# Patient Record
Sex: Female | Born: 1940 | Race: White | Hispanic: No | Smoking: Current every day smoker
Health system: Southern US, Community
[De-identification: ages and names within clinical notes are randomized; demographics above are authoritative.]

## PROBLEM LIST (undated history)

## (undated) DIAGNOSIS — I10 Essential (primary) hypertension: Secondary | ICD-10-CM

## (undated) DIAGNOSIS — K559 Vascular disorder of intestine, unspecified: Secondary | ICD-10-CM

## (undated) DIAGNOSIS — I714 Abdominal aortic aneurysm, without rupture, unspecified: Secondary | ICD-10-CM

## (undated) DIAGNOSIS — I219 Acute myocardial infarction, unspecified: Secondary | ICD-10-CM

## (undated) DIAGNOSIS — H3530 Unspecified macular degeneration: Secondary | ICD-10-CM

## (undated) DIAGNOSIS — J449 Chronic obstructive pulmonary disease, unspecified: Secondary | ICD-10-CM

## (undated) DIAGNOSIS — I2510 Atherosclerotic heart disease of native coronary artery without angina pectoris: Secondary | ICD-10-CM

## (undated) HISTORY — DX: Atherosclerotic heart disease of native coronary artery without angina pectoris: I25.10

## (undated) HISTORY — DX: Chronic obstructive pulmonary disease, unspecified: J44.9

## (undated) HISTORY — PX: CHOLECYSTECTOMY: SHX55

## (undated) HISTORY — PX: HERNIA REPAIR: SHX51

## (undated) HISTORY — PX: OTHER SURGICAL HISTORY: SHX169

---

## 2007-12-02 ENCOUNTER — Ambulatory Visit (HOSPITAL_BASED_OUTPATIENT_CLINIC_OR_DEPARTMENT_OTHER): Admission: RE | Admit: 2007-12-02 | Discharge: 2007-12-02 | Payer: Self-pay | Admitting: Family Medicine

## 2013-05-09 ENCOUNTER — Encounter (HOSPITAL_BASED_OUTPATIENT_CLINIC_OR_DEPARTMENT_OTHER): Payer: Self-pay | Admitting: Emergency Medicine

## 2013-05-09 ENCOUNTER — Inpatient Hospital Stay (HOSPITAL_BASED_OUTPATIENT_CLINIC_OR_DEPARTMENT_OTHER)
Admission: EM | Admit: 2013-05-09 | Discharge: 2013-05-12 | DRG: 394 | Disposition: A | Discharge status: Home / Self Care | Payer: Medicare PPO | Attending: Internal Medicine | Admitting: Internal Medicine

## 2013-05-09 ENCOUNTER — Emergency Department (HOSPITAL_BASED_OUTPATIENT_CLINIC_OR_DEPARTMENT_OTHER): Payer: Medicare PPO

## 2013-05-09 DIAGNOSIS — K5289 Other specified noninfective gastroenteritis and colitis: Secondary | ICD-10-CM

## 2013-05-09 DIAGNOSIS — I2510 Atherosclerotic heart disease of native coronary artery without angina pectoris: Secondary | ICD-10-CM | POA: Diagnosis present

## 2013-05-09 DIAGNOSIS — Z7982 Long term (current) use of aspirin: Secondary | ICD-10-CM

## 2013-05-09 DIAGNOSIS — H3530 Unspecified macular degeneration: Secondary | ICD-10-CM | POA: Diagnosis present

## 2013-05-09 DIAGNOSIS — D72829 Elevated white blood cell count, unspecified: Secondary | ICD-10-CM | POA: Diagnosis present

## 2013-05-09 DIAGNOSIS — J449 Chronic obstructive pulmonary disease, unspecified: Secondary | ICD-10-CM | POA: Diagnosis present

## 2013-05-09 DIAGNOSIS — K922 Gastrointestinal hemorrhage, unspecified: Secondary | ICD-10-CM

## 2013-05-09 DIAGNOSIS — K529 Noninfective gastroenteritis and colitis, unspecified: Secondary | ICD-10-CM

## 2013-05-09 DIAGNOSIS — I714 Abdominal aortic aneurysm, without rupture, unspecified: Secondary | ICD-10-CM | POA: Diagnosis present

## 2013-05-09 DIAGNOSIS — Z79899 Other long term (current) drug therapy: Secondary | ICD-10-CM

## 2013-05-09 DIAGNOSIS — K55059 Acute (reversible) ischemia of intestine, part and extent unspecified: Secondary | ICD-10-CM

## 2013-05-09 DIAGNOSIS — Z888 Allergy status to other drugs, medicaments and biological substances status: Secondary | ICD-10-CM

## 2013-05-09 DIAGNOSIS — K219 Gastro-esophageal reflux disease without esophagitis: Secondary | ICD-10-CM | POA: Diagnosis present

## 2013-05-09 DIAGNOSIS — I739 Peripheral vascular disease, unspecified: Secondary | ICD-10-CM | POA: Diagnosis present

## 2013-05-09 DIAGNOSIS — IMO0002 Reserved for concepts with insufficient information to code with codable children: Secondary | ICD-10-CM

## 2013-05-09 DIAGNOSIS — K633 Ulcer of intestine: Secondary | ICD-10-CM | POA: Diagnosis present

## 2013-05-09 DIAGNOSIS — E876 Hypokalemia: Secondary | ICD-10-CM | POA: Diagnosis not present

## 2013-05-09 DIAGNOSIS — F17200 Nicotine dependence, unspecified, uncomplicated: Secondary | ICD-10-CM | POA: Diagnosis present

## 2013-05-09 DIAGNOSIS — K559 Vascular disorder of intestine, unspecified: Secondary | ICD-10-CM | POA: Diagnosis present

## 2013-05-09 DIAGNOSIS — I10 Essential (primary) hypertension: Secondary | ICD-10-CM | POA: Diagnosis present

## 2013-05-09 DIAGNOSIS — R112 Nausea with vomiting, unspecified: Secondary | ICD-10-CM | POA: Diagnosis present

## 2013-05-09 DIAGNOSIS — E860 Dehydration: Secondary | ICD-10-CM

## 2013-05-09 DIAGNOSIS — J4489 Other specified chronic obstructive pulmonary disease: Secondary | ICD-10-CM | POA: Diagnosis present

## 2013-05-09 DIAGNOSIS — E785 Hyperlipidemia, unspecified: Secondary | ICD-10-CM | POA: Diagnosis present

## 2013-05-09 DIAGNOSIS — I252 Old myocardial infarction: Secondary | ICD-10-CM

## 2013-05-09 DIAGNOSIS — E871 Hypo-osmolality and hyponatremia: Secondary | ICD-10-CM | POA: Diagnosis present

## 2013-05-09 DIAGNOSIS — I7090 Unspecified atherosclerosis: Secondary | ICD-10-CM | POA: Diagnosis present

## 2013-05-09 DIAGNOSIS — Z720 Tobacco use: Secondary | ICD-10-CM | POA: Diagnosis present

## 2013-05-09 DIAGNOSIS — Z66 Do not resuscitate: Secondary | ICD-10-CM | POA: Diagnosis present

## 2013-05-09 HISTORY — DX: Abdominal aortic aneurysm, without rupture: I71.4

## 2013-05-09 HISTORY — DX: Vascular disorder of intestine, unspecified: K55.9

## 2013-05-09 HISTORY — DX: Abdominal aortic aneurysm, without rupture, unspecified: I71.40

## 2013-05-09 HISTORY — DX: Unspecified macular degeneration: H35.30

## 2013-05-09 HISTORY — DX: Acute myocardial infarction, unspecified: I21.9

## 2013-05-09 HISTORY — DX: Essential (primary) hypertension: I10

## 2013-05-09 LAB — PROTIME-INR
INR: 0.98 (ref 0.00–1.49)
Prothrombin Time: 12.8 seconds (ref 11.6–15.2)

## 2013-05-09 LAB — LIPASE, BLOOD: Lipase: 33 U/L (ref 11–59)

## 2013-05-09 LAB — CBC WITH DIFFERENTIAL/PLATELET
BASOS ABS: 0 10*3/uL (ref 0.0–0.1)
Basophils Relative: 0 % (ref 0–1)
EOS ABS: 0.1 10*3/uL (ref 0.0–0.7)
EOS PCT: 1 % (ref 0–5)
HCT: 48.2 % — ABNORMAL HIGH (ref 36.0–46.0)
Hemoglobin: 16.6 g/dL — ABNORMAL HIGH (ref 12.0–15.0)
LYMPHS ABS: 2 10*3/uL (ref 0.7–4.0)
Lymphocytes Relative: 13 % (ref 12–46)
MCH: 33.3 pg (ref 26.0–34.0)
MCHC: 34.4 g/dL (ref 30.0–36.0)
MCV: 96.8 fL (ref 78.0–100.0)
Monocytes Absolute: 1.5 10*3/uL — ABNORMAL HIGH (ref 0.1–1.0)
Monocytes Relative: 10 % (ref 3–12)
NEUTROS PCT: 76 % (ref 43–77)
Neutro Abs: 11.9 10*3/uL — ABNORMAL HIGH (ref 1.7–7.7)
PLATELETS: 313 10*3/uL (ref 150–400)
RBC: 4.98 MIL/uL (ref 3.87–5.11)
RDW: 11.4 % — AB (ref 11.5–15.5)
WBC: 15.5 10*3/uL — AB (ref 4.0–10.5)

## 2013-05-09 LAB — URINALYSIS, ROUTINE W REFLEX MICROSCOPIC
Glucose, UA: NEGATIVE mg/dL
HGB URINE DIPSTICK: NEGATIVE
Ketones, ur: 15 mg/dL — AB
NITRITE: NEGATIVE
PH: 6 (ref 5.0–8.0)
Protein, ur: NEGATIVE mg/dL
SPECIFIC GRAVITY, URINE: 1.024 (ref 1.005–1.030)
Urobilinogen, UA: 1 mg/dL (ref 0.0–1.0)

## 2013-05-09 LAB — COMPREHENSIVE METABOLIC PANEL
ALBUMIN: 4 g/dL (ref 3.5–5.2)
ALK PHOS: 97 U/L (ref 39–117)
ALT: 18 U/L (ref 0–35)
AST: 22 U/L (ref 0–37)
BUN: 14 mg/dL (ref 6–23)
CALCIUM: 10.5 mg/dL (ref 8.4–10.5)
CO2: 32 mEq/L (ref 19–32)
Chloride: 89 mEq/L — ABNORMAL LOW (ref 96–112)
Creatinine, Ser: 0.8 mg/dL (ref 0.50–1.10)
GFR calc non Af Amer: 70 mL/min — ABNORMAL LOW (ref >90)
GFR, EST AFRICAN AMERICAN: 82 mL/min — AB (ref >90)
GLUCOSE: 118 mg/dL — AB (ref 70–99)
POTASSIUM: 3.8 meq/L (ref 3.7–5.3)
SODIUM: 135 meq/L — AB (ref 137–147)
TOTAL PROTEIN: 8.1 g/dL (ref 6.0–8.3)
Total Bilirubin: 0.5 mg/dL (ref 0.3–1.2)

## 2013-05-09 LAB — LACTIC ACID, PLASMA: LACTIC ACID, VENOUS: 1.3 mmol/L (ref 0.5–2.2)

## 2013-05-09 LAB — TYPE AND SCREEN
ABO/RH(D): A POS
ANTIBODY SCREEN: NEGATIVE

## 2013-05-09 LAB — URINE MICROSCOPIC-ADD ON: RBC / HPF: NONE SEEN RBC/hpf (ref <3)

## 2013-05-09 LAB — I-STAT CG4 LACTIC ACID, ED: Lactic Acid, Venous: 1.51 mmol/L (ref 0.5–2.2)

## 2013-05-09 LAB — MRSA PCR SCREENING: MRSA BY PCR: NEGATIVE

## 2013-05-09 LAB — OCCULT BLOOD X 1 CARD TO LAB, STOOL: Fecal Occult Bld: POSITIVE — AB

## 2013-05-09 LAB — MAGNESIUM: MAGNESIUM: 1.7 mg/dL (ref 1.5–2.5)

## 2013-05-09 LAB — PHOSPHORUS: Phosphorus: 3.3 mg/dL (ref 2.3–4.6)

## 2013-05-09 MED ORDER — ACETAMINOPHEN 325 MG PO TABS
650.0000 mg | ORAL_TABLET | Freq: Four times a day (QID) | ORAL | Status: DC | PRN
Start: 2013-05-09 — End: 2013-05-12
  Administered 2013-05-09: 650 mg via ORAL
  Filled 2013-05-09: qty 2

## 2013-05-09 MED ORDER — ONDANSETRON HCL 4 MG/2ML IJ SOLN
4.0000 mg | Freq: Once | INTRAMUSCULAR | Status: AC
  Administered 2013-05-09: 4 mg via INTRAVENOUS
  Filled 2013-05-09: qty 2

## 2013-05-09 MED ORDER — NICOTINE 14 MG/24HR TD PT24
14.0000 mg | MEDICATED_PATCH | Freq: Every day | TRANSDERMAL | Status: DC
  Administered 2013-05-11 – 2013-05-12 (×2): 14 mg via TRANSDERMAL
  Filled 2013-05-09 (×4): qty 1

## 2013-05-09 MED ORDER — ACETAMINOPHEN 650 MG RE SUPP: 650.0000 mg | Freq: Four times a day (QID) | RECTAL | Status: DC | PRN

## 2013-05-09 MED ORDER — ONDANSETRON HCL 4 MG/2ML IJ SOLN: 4.0000 mg | Freq: Four times a day (QID) | INTRAMUSCULAR | Status: DC | PRN

## 2013-05-09 MED ORDER — IOHEXOL 300 MG/ML  SOLN
50.0000 mL | Freq: Once | INTRAMUSCULAR | Status: AC | PRN
  Administered 2013-05-09: 50 mL via ORAL

## 2013-05-09 MED ORDER — ALBUTEROL SULFATE (2.5 MG/3ML) 0.083% IN NEBU: 2.5000 mg | INHALATION_SOLUTION | RESPIRATORY_TRACT | Status: DC | PRN

## 2013-05-09 MED ORDER — SODIUM CHLORIDE 0.9 % IV SOLN
INTRAVENOUS | Status: DC
  Administered 2013-05-09 – 2013-05-12 (×4): via INTRAVENOUS

## 2013-05-09 MED ORDER — FAMOTIDINE 200 MG/20ML IV SOLN
40.0000 mg | INTRAVENOUS | Status: DC
  Administered 2013-05-09 – 2013-05-11 (×3): 40 mg via INTRAVENOUS
  Filled 2013-05-09 (×5): qty 4

## 2013-05-09 MED ORDER — IOHEXOL 300 MG/ML  SOLN
100.0000 mL | Freq: Once | INTRAMUSCULAR | Status: AC | PRN
  Administered 2013-05-09: 100 mL via INTRAVENOUS

## 2013-05-09 MED ORDER — METRONIDAZOLE IN NACL 5-0.79 MG/ML-% IV SOLN
500.0000 mg | Freq: Three times a day (TID) | INTRAVENOUS | Status: DC
  Administered 2013-05-09 – 2013-05-10 (×2): 500 mg via INTRAVENOUS
  Filled 2013-05-09 (×3): qty 100

## 2013-05-09 MED ORDER — MORPHINE SULFATE 2 MG/ML IJ SOLN: 1.0000 mg | INTRAMUSCULAR | Status: DC | PRN

## 2013-05-09 MED ORDER — CIPROFLOXACIN IN D5W 400 MG/200ML IV SOLN
400.0000 mg | Freq: Two times a day (BID) | INTRAVENOUS | Status: DC
  Administered 2013-05-09 – 2013-05-10 (×2): 400 mg via INTRAVENOUS
  Filled 2013-05-09 (×2): qty 200

## 2013-05-09 MED ORDER — METOPROLOL TARTRATE 1 MG/ML IV SOLN
2.5000 mg | Freq: Two times a day (BID) | INTRAVENOUS | Status: DC
  Administered 2013-05-09: 2.5 mg via INTRAVENOUS
  Filled 2013-05-09 (×3): qty 5

## 2013-05-09 MED ORDER — ONDANSETRON HCL 4 MG PO TABS: 4.0000 mg | ORAL_TABLET | Freq: Four times a day (QID) | ORAL | Status: DC | PRN

## 2013-05-09 MED ORDER — SODIUM CHLORIDE 0.9 % IV BOLUS (SEPSIS)
1000.0000 mL | Freq: Once | INTRAVENOUS | Status: AC
  Administered 2013-05-09: 1000 mL via INTRAVENOUS

## 2013-05-09 MED ORDER — PIPERACILLIN-TAZOBACTAM 3.375 G IVPB 30 MIN
3.3750 g | Freq: Once | INTRAVENOUS | Status: AC
  Administered 2013-05-09: 3.375 g via INTRAVENOUS
  Filled 2013-05-09 (×2): qty 50

## 2013-05-09 NOTE — ED Notes (Signed)
Abdominal pain for a week. Diarrhea. Vomiting. She had a CT scan of her abdomen that showed left ovarian swelling per pt. The plan is to f/u with a GYN.

## 2013-05-09 NOTE — ED Notes (Signed)
Pt informed of plan of care.  Waiting on bed assignment.

## 2013-05-09 NOTE — ED Notes (Signed)
Patient in restroom.

## 2013-05-09 NOTE — ED Notes (Signed)
Family at bedside. 

## 2013-05-09 NOTE — Consult Note (Signed)
Reason for Consult:  ischeemic bowel Referring Physician:  Dr. Pattricia Boss  Evelyn Miller is an 73 y.o. female.  HPI:  Patient is a 73 year old female with significant history for peripheral vascular disease and abdominal aortic aneurysm who presents with 2 weeks of worsening abdominal pain and diarrhea after eating. She has been afraid to eat because of the symptoms. She has a known aneurysm that is being followed by Dr. Maryjean Morn in [facility].  She denies any fevers and chills. She has started to develop bloody stools in association with the crampy pain.  She describes the pain as more of a sore crampy discomfort than a severe stabbing pain. The pain is better when she does not eat and when she does not have bowel movements.  Past Medical History  Diagnosis Date  . Hypertension   . MI (myocardial infarction)   . AAA (abdominal aortic aneurysm)     Past Surgical History  Procedure Laterality Date  . Cholecystectomy    . Abdominal hysterectomy    . Hernia repair      No family history on file.  Social History:  reports that she has been smoking Cigarettes.  She has been smoking about 1.00 pack per day. She does not have any smokeless tobacco history on file. She reports that she drinks alcohol. She reports that she does not use illicit drugs.  Allergies:  Allergies  Allergen Reactions  . Codeine     weakness    Medications:  Prior to Admission:  Prescriptions prior to admission  Medication Sig Dispense Refill  . aspirin 81 MG tablet Take 81 mg by mouth daily.      Marland Kitchen losartan-hydrochlorothiazide (HYZAAR) 100-25 MG per tablet Take 1 tablet by mouth daily.      Marland Kitchen OMEPRAZOLE PO Take by mouth.      . Ondansetron HCl (ZOFRAN PO) Take by mouth.      . propranolol (INDERAL) 80 MG tablet Take 80 mg by mouth 3 (three) times daily.      . TRAMADOL HCL PO Take by mouth.        Results for orders placed during the hospital encounter of 05/09/13 (from the past 48 hour(s))  URINALYSIS,  ROUTINE W REFLEX MICROSCOPIC     Status: Abnormal   Collection Time    05/09/13  2:01 PM      Result Value Ref Range   Color, Urine AMBER (*) YELLOW   Comment: BIOCHEMICALS MAY BE AFFECTED BY COLOR   APPearance CLOUDY (*) CLEAR   Specific Gravity, Urine 1.024  1.005 - 1.030   pH 6.0  5.0 - 8.0   Glucose, UA NEGATIVE  NEGATIVE mg/dL   Hgb urine dipstick NEGATIVE  NEGATIVE   Bilirubin Urine SMALL (*) NEGATIVE   Ketones, ur 15 (*) NEGATIVE mg/dL   Protein, ur NEGATIVE  NEGATIVE mg/dL   Urobilinogen, UA 1.0  0.0 - 1.0 mg/dL   Nitrite NEGATIVE  NEGATIVE   Leukocytes, UA TRACE (*) NEGATIVE  URINE MICROSCOPIC-ADD ON     Status: Abnormal   Collection Time    05/09/13  2:01 PM      Result Value Ref Range   Squamous Epithelial / LPF FEW (*) RARE   WBC, UA 0-2  <3 WBC/hpf   RBC / HPF    <3 RBC/hpf   Value: NO FORMED ELEMENTS SEEN ON URINE MICROSCOPIC EXAMINATION   Bacteria, UA FEW (*) RARE  CBC WITH DIFFERENTIAL     Status: Abnormal   Collection  Time    05/09/13  2:20 PM      Result Value Ref Range   WBC 15.5 (*) 4.0 - 10.5 K/uL   RBC 4.98  3.87 - 5.11 MIL/uL   Hemoglobin 16.6 (*) 12.0 - 15.0 g/dL   HCT 48.2 (*) 36.0 - 46.0 %   MCV 96.8  78.0 - 100.0 fL   MCH 33.3  26.0 - 34.0 pg   MCHC 34.4  30.0 - 36.0 g/dL   RDW 11.4 (*) 11.5 - 15.5 %   Platelets 313  150 - 400 K/uL   Neutrophils Relative % 76  43 - 77 %   Neutro Abs 11.9 (*) 1.7 - 7.7 K/uL   Lymphocytes Relative 13  12 - 46 %   Lymphs Abs 2.0  0.7 - 4.0 K/uL   Monocytes Relative 10  3 - 12 %   Monocytes Absolute 1.5 (*) 0.1 - 1.0 K/uL   Eosinophils Relative 1  0 - 5 %   Eosinophils Absolute 0.1  0.0 - 0.7 K/uL   Basophils Relative 0  0 - 1 %   Basophils Absolute 0.0  0.0 - 0.1 K/uL  COMPREHENSIVE METABOLIC PANEL     Status: Abnormal   Collection Time    05/09/13  2:20 PM      Result Value Ref Range   Sodium 135 (*) 137 - 147 mEq/L   Potassium 3.8  3.7 - 5.3 mEq/L   Chloride 89 (*) 96 - 112 mEq/L   CO2 32  19 - 32  mEq/L   Glucose, Bld 118 (*) 70 - 99 mg/dL   BUN 14  6 - 23 mg/dL   Creatinine, Ser 0.80  0.50 - 1.10 mg/dL   Calcium 10.5  8.4 - 10.5 mg/dL   Total Protein 8.1  6.0 - 8.3 g/dL   Albumin 4.0  3.5 - 5.2 g/dL   AST 22  0 - 37 U/L   ALT 18  0 - 35 U/L   Alkaline Phosphatase 97  39 - 117 U/L   Total Bilirubin 0.5  0.3 - 1.2 mg/dL   GFR calc non Af Amer 70 (*) >90 mL/min   GFR calc Af Amer 82 (*) >90 mL/min   Comment: (NOTE)     The eGFR has been calculated using the CKD EPI equation.     This calculation has not been validated in all clinical situations.     eGFR's persistently <90 mL/min signify possible Chronic Kidney     Disease.  LIPASE, BLOOD     Status: None   Collection Time    05/09/13  2:20 PM      Result Value Ref Range   Lipase 33  11 - 59 U/L  OCCULT BLOOD X 1 CARD TO LAB, STOOL     Status: Abnormal   Collection Time    05/09/13  2:20 PM      Result Value Ref Range   Fecal Occult Bld POSITIVE (*) NEGATIVE  PROTIME-INR     Status: None   Collection Time    05/09/13  5:30 PM      Result Value Ref Range   Prothrombin Time 12.8  11.6 - 15.2 seconds   INR 0.98  0.00 - 1.49  I-STAT CG4 LACTIC ACID, ED     Status: None   Collection Time    05/09/13  5:38 PM      Result Value Ref Range   Lactic Acid, Venous 1.51  0.5 - 2.2  mmol/L    Ct Abdomen Pelvis W Contrast  05/09/2013   CLINICAL DATA:  Chest pain, lower abdominal pain.  EXAM: CT ABDOMEN AND PELVIS WITH CONTRAST  TECHNIQUE: Multidetector CT imaging of the abdomen and pelvis was performed using the standard protocol following bolus administration of intravenous contrast.  CONTRAST:  28mL OMNIPAQUE IOHEXOL 300 MG/ML SOLN, 124mL OMNIPAQUE IOHEXOL 300 MG/ML SOLN  COMPARISON:  CT ABD-PELV W/ CM dated 05/02/2013  FINDINGS: Lung bases are clear.  No pericardial fluid.  No focal hepatic lesion. Post cholecystectomy. Common bile duct is dilated to the level ampulla measuring 13 mm. This is not changed in short interval follow-up.  Pancreas is normal without evidence of pancreatic duct dilatation. No intrahepatic duct dilatation of significance.  The spleen, adrenal glands, and kidneys are normal.  The stomach, small bowel, appendix, and cecum are normal. There is mild bowel wall thickening involving the descending colon over a long segment extending from the splenic flexure to the sigmoid colon (axial image 56 and coronal image 50 by for example. There is minimal diverticular disease through this region. Rectum is normal.  Abdominal aorta is aneurysmal measuring 4.8 x 4.5 cm in axial dimension unchanged from prior. Aneurysm dilatation of the suprarenal aorta measures 5.5 cm. The SMA is not diminutive seen on sagittal image 60. The IMA is not well appreciated.  No retroperitoneal periportal lymphadenopathy. No free fluid the pelvis. Hysterectomy. The bladder is normal. The uterus is normal. No aggressive osseous lesion. Process in the sacrum noted.  IMPRESSION: 1. Long segment of bowel wall inflammation involving the descending colon is new from scan 1 week prior. This finding is consistent with segmental colitis. Concern for ischemic colitis in this watershed location. The SMA is diminutive extending from the aneurysmal aorta. Secondary differentials would include infectious or inflammatory colitis including inflammatory bowel disease. 2. The SMA is diminutive and the IMA is difficult to define. The celiac trunk is the most robust branch vessel. 3. Stable severe aneurysmal dilatation of the infrarenal and suprarenal abdominal aorta.   Electronically Signed   By: Suzy Bouchard M.D.   On: 05/09/2013 16:12   Dg Chest Portable 1 View  05/09/2013   CLINICAL DATA:  Pain  EXAM: PORTABLE CHEST - 1 VIEW  COMPARISON:  December 02, 2007  FINDINGS: There is underlying emphysematous change. There is no edema or consolidation. Heart is mildly enlarged with pulmonary vascularity within normal limits. Aorta is tortuous and rather prominent but stable.  No adenopathy. No bone lesions.  IMPRESSION: Underlying emphysematous change. No edema or consolidation. Heart prominent but stable. Aorta tortuous but stable.   Electronically Signed   By: Lowella Grip M.D.   On: 05/09/2013 16:20    Review of Systems  Constitutional: Positive for chills and malaise/fatigue. Negative for fever.  HENT: Negative.   Eyes: Negative.   Respiratory: Negative.   Cardiovascular: Negative.   Gastrointestinal: Positive for nausea, abdominal pain, diarrhea and blood in stool.  Genitourinary: Negative.   Musculoskeletal: Negative.   Skin: Negative.   Neurological: Negative.   Endo/Heme/Allergies: Negative.   Psychiatric/Behavioral: Negative.    Blood pressure 135/92, pulse 82, temperature 98 F (36.7 C), temperature source Oral, resp. rate 16, SpO2 100.00%. Physical Exam  Constitutional: She is oriented to person, place, and time. She appears well-developed and well-nourished. No distress.  HENT:  Head: Normocephalic and atraumatic.  Mouth/Throat: No oropharyngeal exudate.  Eyes: Conjunctivae are normal. Pupils are equal, round, and reactive to light. Right eye exhibits no discharge.  Left eye exhibits no discharge. No scleral icterus.  Neck: Normal range of motion. Neck supple. No thyromegaly present.  Cardiovascular: Normal rate, regular rhythm and normal heart sounds.  Exam reveals no gallop and no friction rub.   No murmur heard. Faint distal pulses.  Respiratory: Effort normal. No respiratory distress. She has wheezes.  GI: Soft. Bowel sounds are normal. She exhibits no distension. There is no tenderness. There is no rebound and no guarding.  Musculoskeletal: Normal range of motion. She exhibits no edema.  Neurological: She is alert and oriented to person, place, and time. Coordination normal.  Skin: Skin is warm and dry. She is not diaphoretic.  Psychiatric: She has a normal mood and affect. Her behavior is normal. Judgment and thought content normal.     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   Dehydration GI bleed.  Would treat with hydration, avoiding hypotension, bowel rest, and IV antibiotics for possible translocation. Recommend GI consult. No acute surgical intervention. Will follow.   Dr. Denyce Robert in [facility] is her vascular surgeon.  We do not have the ability to access what prior workup he  has done or what his opinion of her mesenteric vascular disease is and whether or not she would benefit from any intervention.     Evelyn Miller 05/09/2013, 8:13 PM

## 2013-05-09 NOTE — ED Provider Notes (Signed)
CSN: [id number]     Arrival date & time 05/09/13  1316 History   First MD Initiated Contact with Patient 05/09/13 1352     Chief Complaint  Patient presents with  . Abdominal Pain     (Consider location/radiation/quality/duration/timing/severity/associated sxs/prior Treatment) HPI This is a 73 year old female who comes in today complaining of abdominal pain. She states she has had a two-week history of abdominal discomfort. It began with some lower abdominal cramping followed by diarrhea. She states she had multiple episodes of diarrhea then began having nausea and vomiting for several days. She was seen by her primary care physician and reports that she had a CT of her abdomen. She states that she was told there is some abnormality of blood flow in the pelvis and she was going to be referred to gynecology. She has so far not received gynecology followup. She states that she has an abdominal aneurysm and she had an ultrasound done for that the day after her CT scan. She states she has been followed for this and it has remained stable.  She has had very little stooling over the past week. She has had decreased intake but states that she has been drinking Gatorade and ginger ale and Ensure. She denies fever although she has had some chills. Her son has had some similar symptoms with stooling. She reports no traveling, camping, or other sick contacts, or known food issues. She has some feelings of general mobilized and weakness. She denies headache, head pain, chest pain, dyspnea, cough, or urinary tract infection symptoms. Past Medical History  Diagnosis Date  . Hypertension   . MI (myocardial infarction)   . AAA (abdominal aortic aneurysm)    Past Surgical History  Procedure Laterality Date  . Cholecystectomy    . Abdominal hysterectomy    . Hernia repair     No family history on file. History  Substance Use Topics  . Smoking status: Current Every Day Smoker -- 1.00 packs/day    Types:  Cigarettes  . Smokeless tobacco: Not on file  . Alcohol Use: Yes   OB History   Grav Para Term Preterm Abortions TAB SAB Ect Mult Living                 Review of Systems  Constitutional: Positive for chills and activity change. Negative for fever and unexpected weight change.  HENT: Negative.   Eyes: Negative.   Respiratory: Negative.   Cardiovascular: Negative.   Gastrointestinal: Positive for nausea, vomiting, diarrhea and constipation.  Endocrine: Negative.   Genitourinary: Negative.   Musculoskeletal: Negative.   Skin: Negative.   Allergic/Immunologic: Negative.   Neurological: Negative.   Hematological: Negative.   Psychiatric/Behavioral: Negative.   All other systems reviewed and are negative.     Allergies  Codeine  Home Medications   Prior to Admission medications   Medication Sig Start Date End Date Taking? Authorizing Provider  aspirin 81 MG tablet Take 81 mg by mouth daily.   Yes Historical Provider, MD  losartan-hydrochlorothiazide (HYZAAR) 100-25 MG per tablet Take 1 tablet by mouth daily.   Yes Historical Provider, MD  OMEPRAZOLE PO Take by mouth.   Yes Historical Provider, MD  Ondansetron HCl (ZOFRAN PO) Take by mouth.   Yes Historical Provider, MD  propranolol (INDERAL) 80 MG tablet Take 80 mg by mouth 3 (three) times daily.   Yes Historical Provider, MD  TRAMADOL HCL PO Take by mouth.   Yes Historical Provider, MD   BP 171/90  Pulse 65  Temp(Src) 97.8 F (36.6 C) (Oral)  Resp 20  SpO2 100% Physical Exam  Nursing note and vitals reviewed. Constitutional: She is oriented to person, place, and time. She appears well-developed.  HENT:  Head: Normocephalic.  Right Ear: External ear normal.  Left Ear: External ear normal.  Nose: Nose normal.  Mouth/Throat: Oropharynx is clear and moist.  Eyes: Conjunctivae and EOM are normal. Pupils are equal, round, and reactive to light.  Neck: Normal range of motion. Neck supple.  Cardiovascular: Normal rate,  regular rhythm, normal heart sounds and intact distal pulses.   Pulmonary/Chest: Effort normal and breath sounds normal.  Abdominal: Soft. Bowel sounds are normal. She exhibits no distension and no mass. There is tenderness. There is rebound. There is no guarding.  Musculoskeletal: Normal range of motion.  Neurological: She is alert and oriented to person, place, and time. She has normal reflexes.  Skin: Skin is warm and dry.  Psychiatric: She has a normal mood and affect. Her behavior is normal. Judgment and thought content normal.    ED Course  Procedures (including critical care time) Labs Review Labs Reviewed  URINALYSIS, ROUTINE W REFLEX MICROSCOPIC - Abnormal; Notable for the following:    Color, Urine AMBER (*)    APPearance CLOUDY (*)    Bilirubin Urine SMALL (*)    Ketones, ur 15 (*)    Leukocytes, UA TRACE (*)    All other components within normal limits  URINE MICROSCOPIC-ADD ON - Abnormal; Notable for the following:    Squamous Epithelial / LPF FEW (*)    Bacteria, UA FEW (*)    All other components within normal limits  CBC WITH DIFFERENTIAL - Abnormal; Notable for the following:    WBC 15.5 (*)    Hemoglobin 16.6 (*)    HCT 48.2 (*)    RDW 11.4 (*)    Neutro Abs 11.9 (*)    Monocytes Absolute 1.5 (*)    All other components within normal limits  COMPREHENSIVE METABOLIC PANEL - Abnormal; Notable for the following:    Sodium 135 (*)    Chloride 89 (*)    Glucose, Bld 118 (*)    GFR calc non Af Amer 70 (*)    GFR calc Af Amer 82 (*)    All other components within normal limits  LIPASE, BLOOD  OCCULT BLOOD X 1 CARD TO LAB, STOOL    Imaging Review Ct Abdomen Pelvis W Contrast  05/09/2013   CLINICAL DATA:  Chest pain, lower abdominal pain.  EXAM: CT ABDOMEN AND PELVIS WITH CONTRAST  TECHNIQUE: Multidetector CT imaging of the abdomen and pelvis was performed using the standard protocol following bolus administration of intravenous contrast.  CONTRAST:  50mL  OMNIPAQUE IOHEXOL 300 MG/ML SOLN, OMNIPAQUE IOHEXOL 300 MG/ML SOLN  COMPARISON:  CT ABD-PELV W/ CM dated 05/02/2013  FINDINGS: Lung bases are clear.  No pericardial fluid.  No focal hepatic lesion. Post cholecystectomy. Common bile duct is dilated to the level ampulla measuring 13 mm. This is not changed in short interval follow-up. Pancreas is normal without evidence of pancreatic duct dilatation. No intrahepatic duct dilatation of significance.  The spleen, adrenal glands, and kidneys are normal.  The stomach, small bowel, appendix, and cecum are normal. There is mild bowel wall thickening involving the descending colon over a long segment extending from the splenic flexure to the sigmoid colon (axial image 56 and coronal image 50 by for example. There is minimal diverticular disease through this region.  Rectum is normal.  Abdominal aorta is aneurysmal measuring 4.8 x 4.5 cm in axial dimension unchanged from prior. Aneurysm dilatation of the suprarenal aorta measures 5.5 cm. The SMA is not diminutive seen on sagittal image 60. The IMA is not well appreciated.  No retroperitoneal periportal lymphadenopathy. No free fluid the pelvis. Hysterectomy. The bladder is normal. The uterus is normal. No aggressive osseous lesion. Process in the sacrum noted.  IMPRESSION: 1. Long segment of bowel wall inflammation involving the descending colon is new from scan 1 week prior. This finding is consistent with segmental colitis. Concern for ischemic colitis in this watershed location. The SMA is diminutive extending from the aneurysmal aorta. Secondary differentials would include infectious or inflammatory colitis including inflammatory bowel disease. 2. The SMA is diminutive and the IMA is difficult to define. The celiac trunk is the most robust branch vessel. 3. Stable severe aneurysmal dilatation of the infrarenal and suprarenal abdominal aorta.   Electronically Signed   By: Genevive BiStewart  Edmunds M.D.   On: 05/09/2013 16:12    Dg Chest Portable 1 View  05/09/2013   CLINICAL DATA:  Pain  EXAM: PORTABLE CHEST - 1 VIEW  COMPARISON:  December 02, 2007  FINDINGS: There is underlying emphysematous change. There is no edema or consolidation. Heart is mildly enlarged with pulmonary vascularity within normal limits. Aorta is tortuous and rather prominent but stable. No adenopathy. No bone lesions.  IMPRESSION: Underlying emphysematous change. No edema or consolidation. Heart prominent but stable. Aorta tortuous but stable.   Electronically Signed   By: Bretta BangWilliam  Woodruff M.D.   On: 05/09/2013 16:20     EKG Interpretation   Date/Time:  Tuesday May 09 2013 15:01:59 EDT Ventricular Rate:  67 PR Interval:  174 QRS Duration: 102 QT Interval:  400 QTC Calculation: 422 R Axis:   23 Text Interpretation:  Normal sinus rhythm Anterior infarct , age  undetermined Abnormal ECG Confirmed by Aila Terra MD, Duwayne HeckANIELLE [phone]([id number]) on  05/09/2013 3:39:11 PM       Called to patient's bedside by nursing. Reported that patient became mottled following CT scan and felt like she needed to have a bowel movement and they noted blood on the bed. Patient continues somewhat pale with some increased shortness of breath with mottled fingers. Abdomen continues tender in the lower quadrants. Review of previous Hemoccult is positive. Stool now appears to be somewhat bloody. Follow my exam patient did have a large loose dark stool consistent with having blood. I feel this is likely secondary to ischemic colitis consistent with findings on her CT scan and history. I discussed these findings the patient and her family. General surgery is being contacted.  She will also be covered with antibiotics for possible infectious colitis. MDM   Final diagnoses:  GI bleeding  Colitis   Discussed with Drs. Donell BeersByerly and [place] and patient [facility]   CRITICAL CARE Performed by: Hilario Quarryanielle S Medea Deines Total critical care time: 2745 Critical care  time was exclusive of separately billable procedures and treating other patients. Critical care was necessary to treat or prevent imminent or life-threatening deterioration. Critical care was time spent personally by me on the following activities: development of treatment plan with patient and/or surrogate as well as nursing, discussions with consultants, evaluation of patient's response to treatment, examination of patient, obtaining history from patient or surrogate, ordering and performing treatments and interventions, ordering and review of laboratory studies, ordering and review of radiographic studies, pulse oximetry  and re-evaluation of patient's condition.      Hilario Quarry, MD 05/09/13 [phone]

## 2013-05-09 NOTE — ED Notes (Signed)
PO contrast administered.

## 2013-05-09 NOTE — ED Notes (Signed)
Patient transported to CT 

## 2013-05-09 NOTE — ED Notes (Signed)
Called to CT by CT Tech. Pt c/o abdominal pain and need to have a BM.  She appears tachypneic and extremities cool.  EDP informed and at bedside.  PT CAOx4.  #2 IV site established.

## 2013-05-09 NOTE — ED Notes (Signed)
Radiology at bedside for portable XR.

## 2013-05-09 NOTE — ED Notes (Signed)
[facility]

## 2013-05-09 NOTE — ED Notes (Signed)
MD at bedside. 

## 2013-05-09 NOTE — ED Notes (Signed)
[facility] at bedside preparing to transport. 

## 2013-05-09 NOTE — ED Notes (Signed)
Pt reports she has had abdominal pain for over a week and is described as cramping and increases with paplation.  She was seen by PA at [facility] on Tuesday, had an XR and told she has "blood vessels around my left ovary enlarged".  Took Tramadol and Zofran over the weekend, felt better but today pain seems worse and she is unable to keep PO or food down.  States she cannot recall last normal BM and feels constipated.

## 2013-05-09 NOTE — Progress Notes (Addendum)
[facility]. Patient is Evelyn Madeiraorma Moncure, 73 y/o female with pmh significant for tobacco abuse, HLD, known AAA, CAD/MI in the past and HTN; presented with 2 weeks hx of on/off abd pain. Patient reports pain is worse with eating and has been associated with nausea, vomiting, diarrhea initially and then maroon color stools. She reports no traveling, camping, or other sick contacts, or known food issues. She has some feelings of general mobilized and weakness. She denies headache, head pain, chest pain, dyspnea, cough, or urinary tract infection symptoms. [facility] called to admit patient for further evaluation and treatment. Most likely ischemic colitis; other considerations includes IBD vs infectious colitis.  [facility]. General surgery and GI has been consulted by ED.  Plan: -Admit to step down -Cipro/flagyl empirically for overlapping on infectious colitis (discussed with GI and in agreement) -Check C. Diff by PCR -NPO status for bowel rest -Avoid hypotension (hold home antihypertensive regimen) -PRN analgesics and antiemetics -IVF's and supportive care   Evelyn Miller [phone]

## 2013-05-09 NOTE — H&P (Signed)
[facility] History and Physical  Cherina Dhillon WUJ:[id number] DOB: [birth year] DOA: 05/09/2013  Referring physician: Dr. Rosalia Hammers PCP: Angelica Chessman., MD   Chief Complaint: abd pain, nausea/vomiting, maroon stools  HPI: Evelyn Miller is a 73 y.o. female with pmh significant for tobacco abuse, HLD, known AAA (w/o mentioned of rupture or repair), CAD/MI in the past, GERD and HTN; who presented to [facility] ED 2 weeks hx of on/off abd pain. Patient reports pain is worse with food intake and associated with nausea, vomiting, diarrhea initially and then maroon color stools. She reports no traveling, camping, other sick contacts. Patient endorses general malaise and weakness, along with anorexia. She denies headache, chest pain, dyspnea, cough, fever, chills, hematemesis or urinary tract infection symptoms. [facility] called to admit patient for further evaluation and treatment.   Most likely ischemic colitis; other considerations includes IBD vs infectious colitis. IVF's along with empiric abx's started in ED.   Review of Systems:  Negative except otherwise mentioned on HPI.  Past Medical History  Diagnosis Date  . Hypertension   . MI (myocardial infarction)   . AAA (abdominal aortic aneurysm)   . Macular degeneration     in left eye   Past Surgical History  Procedure Laterality Date  . Cholecystectomy    . Hernia repair    . Stent in left leg     Social History:  reports that she has been smoking Cigarettes.  She has a 60 pack-year smoking history. She has never used smokeless tobacco. She reports that she drinks alcohol. She reports that she does not use illicit drugs.  Allergies  Allergen Reactions  . Other     [place] nuts make pt's throat itch.  . Codeine     weakness    Family Hx: positive for tobacco abuse, HTN and HLD; otherwise non contributory   Prior to Admission medications   Medication Sig Start Date End Date Taking? Authorizing Provider  aspirin 81 MG tablet Take 81 mg by  mouth daily.   Yes Historical Provider, MD  losartan-hydrochlorothiazide (HYZAAR) 100-25 MG per tablet Take 1 tablet by mouth daily.   Yes Historical Provider, MD  omeprazole (PRILOSEC) 40 MG capsule Take 40 mg by mouth daily.   Yes Historical Provider, MD  ondansetron (ZOFRAN) 4 MG tablet Take 4 mg by mouth every 12 (twelve) hours as needed for nausea or vomiting.   Yes Historical Provider, MD  PRESCRIPTION MEDICATION Place 1 application in ear(s) 3 (three) times a week. Ear itching   Yes Historical Provider, MD  propranolol (INDERAL) 80 MG tablet Take 80 mg by mouth daily.    Yes Historical Provider, MD   Physical Exam: Filed Vitals:   05/09/13 2100  BP: 121/71  Pulse: 74  Temp:   Resp: 15    BP 121/71  Pulse 74  Temp(Src) 98 F (36.7 C) (Oral)  Resp 15  Ht 5' (1.524 m)  Wt 57.1 kg (125 lb 14.1 oz)  BMI 24.58 kg/m2  SpO2 94%  General:  Appears calm and comfortable on my examination; no fever, cooperative and able to follow commands and speak in full sentences Eyes: PERRL, normal lids, irises & conjunctiva, no icterus, EOMI, no nystagmus ENT: grossly normal hearing, dry MM, no erythema or exudates inside her mouth; no drainage out of ears or nostrils  Neck: no LAD, masses or thyromegaly, no JVD Cardiovascular: RRR, no m/r/g. No LE edema. Respiratory: CTA bilaterally, no w/r/r. Normal respiratory effort. Abdomen: soft, LLQ and and accross lower area  to RLQ; no distension, positive BS, no guarding, no rebound Skin: no rash or induration seen on limited exam Musculoskeletal: grossly normal tone BUE/BLE Psychiatric: grossly normal mood and affect, speech fluent and appropriate Neurologic: grossly non-focal.          Labs on Admission:  Basic Metabolic Panel:  Recent Labs Lab 05/09/13 1420  NA 135*  K 3.8  CL 89*  CO2 32  GLUCOSE 118*  BUN 14  CREATININE 0.80  CALCIUM 10.5   Liver Function Tests:  Recent Labs Lab 05/09/13 1420  AST 22  ALT 18  ALKPHOS 97    BILITOT 0.5  PROT 8.1  ALBUMIN 4.0    Recent Labs Lab 05/09/13 1420  LIPASE 33   CBC:  Recent Labs Lab 05/09/13 1420  WBC 15.5*  NEUTROABS 11.9*  HGB 16.6*  HCT 48.2*  MCV 96.8  PLT 313   Radiological Exams on Admission: Ct Abdomen Pelvis W Contrast  05/09/2013   CLINICAL DATA:  Chest pain, lower abdominal pain.  EXAM: CT ABDOMEN AND PELVIS WITH CONTRAST  TECHNIQUE: Multidetector CT imaging of the abdomen and pelvis was performed using the standard protocol following bolus administration of intravenous contrast.  CONTRAST:  50mL OMNIPAQUE IOHEXOL 300 MG/ML SOLN, 100mL OMNIPAQUE IOHEXOL 300 MG/ML SOLN  COMPARISON:  CT ABD-PELV W/ CM dated 05/02/2013  FINDINGS: Lung bases are clear.  No pericardial fluid.  No focal hepatic lesion. Post cholecystectomy. Common bile duct is dilated to the level ampulla measuring 13 mm. This is not changed in short interval follow-up. Pancreas is normal without evidence of pancreatic duct dilatation. No intrahepatic duct dilatation of significance.  The spleen, adrenal glands, and kidneys are normal.  The stomach, small bowel, appendix, and cecum are normal. There is mild bowel wall thickening involving the descending colon over a long segment extending from the splenic flexure to the sigmoid colon (axial image 56 and coronal image 50 by for example. There is minimal diverticular disease through this region. Rectum is normal.  Abdominal aorta is aneurysmal measuring 4.8 x 4.5 cm in axial dimension unchanged from prior. Aneurysm dilatation of the suprarenal aorta measures 5.5 cm. The SMA is not diminutive seen on sagittal image 60. The IMA is not well appreciated.  No retroperitoneal periportal lymphadenopathy. No free fluid the pelvis. Hysterectomy. The bladder is normal. The uterus is normal. No aggressive osseous lesion. Process in the sacrum noted.  IMPRESSION: 1. Long segment of bowel wall inflammation involving the descending colon is new from scan 1 week  prior. This finding is consistent with segmental colitis. Concern for ischemic colitis in this watershed location. The SMA is diminutive extending from the aneurysmal aorta. Secondary differentials would include infectious or inflammatory colitis including inflammatory bowel disease. 2. The SMA is diminutive and the IMA is difficult to define. The celiac trunk is the most robust branch vessel. 3. Stable severe aneurysmal dilatation of the infrarenal and suprarenal abdominal aorta.   Electronically Signed   By: Genevive BiStewart  Edmunds M.D.   On: 05/09/2013 16:12   Dg Chest Portable 1 View  05/09/2013   CLINICAL DATA:  Pain  EXAM: PORTABLE CHEST - 1 VIEW  COMPARISON:  December 02, 2007  FINDINGS: There is underlying emphysematous change. There is no edema or consolidation. Heart is mildly enlarged with pulmonary vascularity within normal limits. Aorta is tortuous and rather prominent but stable. No adenopathy. No bone lesions.  IMPRESSION: Underlying emphysematous change. No edema or consolidation. Heart prominent but stable. Aorta tortuous but stable.  Electronically Signed   By: Bretta BangWilliam  Woodruff M.D.   On: 05/09/2013 16:20    EKG: Ventricular Rate: 67  PR Interval: 174  QRS Duration: 102  QT Interval: 400  QTC Calculation: 422  R Axis: 23  Text Interpretation: Normal sinus rhythm, Q waves suggesting anterior infarct , age  Undetermined.  Assessment/Plan 1-Colitis: -Admit to step down  -Cipro/flagyl empirically for overlapping on infectious colitis and/or translocation (discussed with GI and in agreement)  -Check C. Diff by PCR and GI pathogen panel -NPO status for bowel rest  -Avoid hypotension (hold home antihypertensive oral regimen)  -PRN analgesics and antiemetics  -IVF's and supportive care  2-GI bleeding: most likely secondary to ischemic colitis; other considerations includes diverticulosis. No colonoscopy on records. -will provide supportive care -follow Hgb trend -type and  screen -NPO -GI has been consulted and will follow rec's  3-Nausea with vomiting: secondary to #1. -will use PRN antiemetics  4-Leukocytosis, unspecified: stress demargination vs infectious colitis. -as mentioned above will start empirically cipro and flagyl -follow C. Diff and GI pathogen panel  5-Dehydration and mild hyponatremia: will provide IVF's resuscitation  6-Tobacco abuse: counseling cessation provided. -nicotine patch ordered  7-Obstructive chronic bronchitis without exacerbation: due to tobacco abuse most likely. -no fever, wheezing or productive cough currently -will continue PRN albuterol  8-HLD (hyperlipidemia): with statins intolerance. Will check lipid profile, if need will add gemfibrozil/fish oil  9-GERD (gastroesophageal reflux disease): will use IV pepcid while r/o C. Diff. -plan is to resume PPI if neg C. Diff   10-HTN (hypertension): will hold PO antihypertensive agent initially due to NPO status -will start lopressor 2.5 Q8H and follow VS -main goal is avoid Hypotension, while keeping BP not to high and prevent rebound tachycardia  11-AAA (abdominal aortic aneurysm) without rupture: stable by CT scan. Patient will continue follow up with vascular surgery as an outpatient. ([facility])  DVT: SCD's   GI (Dr. Juanda ChanceBrodie, [facility]) General surgery (Called by ED, Dr. Donell BeersByerly)  Code Status: DNR Family Communication: daughters at bedside Disposition Plan: LOS > 2 midnights, inpatient, stepdown  Time spent: 50 minutes  Vassie Lollarlos Lamondre Wesche [facility] Pager [phone]

## 2013-05-09 NOTE — Progress Notes (Signed)
I have received request for GI consult from Dr Gwenlyn PerkingMadera and have discussed the case with him, which is c/w acute ischemic colitis. For tonight IV hydration, obtain stool for pathogens. Leukocytosis likely due to hemoconcentration. OK to use empirical Flagyl/Cipro pending improvement of the condition in next 24 hours. Dr Gessner/Health extender , will see pt in am.

## 2013-05-10 DIAGNOSIS — R109 Unspecified abdominal pain: Secondary | ICD-10-CM

## 2013-05-10 LAB — BASIC METABOLIC PANEL
BUN: 9 mg/dL (ref 6–23)
CALCIUM: 8.8 mg/dL (ref 8.4–10.5)
CO2: 28 meq/L (ref 19–32)
CREATININE: 0.82 mg/dL (ref 0.50–1.10)
Chloride: 97 mEq/L (ref 96–112)
GFR calc Af Amer: 79 mL/min — ABNORMAL LOW (ref >90)
GFR, EST NON AFRICAN AMERICAN: 68 mL/min — AB (ref >90)
GLUCOSE: 92 mg/dL (ref 70–99)
Potassium: 3.3 mEq/L — ABNORMAL LOW (ref 3.7–5.3)
Sodium: 136 mEq/L — ABNORMAL LOW (ref 137–147)

## 2013-05-10 LAB — GI PATHOGEN PANEL BY PCR, STOOL
C DIFFICILE TOXIN A/B: NEGATIVE
CRYPTOSPORIDIUM BY PCR: NEGATIVE
Campylobacter by PCR: NEGATIVE
E coli (ETEC) LT/ST: NEGATIVE
E coli (STEC): NEGATIVE
E coli 0157 by PCR: NEGATIVE
G lamblia by PCR: NEGATIVE
NOROVIRUS G1/G2: NEGATIVE
ROTAVIRUS A BY PCR: NEGATIVE
Salmonella by PCR: NEGATIVE
Shigella by PCR: NEGATIVE

## 2013-05-10 LAB — LIPID PANEL
Cholesterol: 156 mg/dL (ref 0–200)
HDL: 27 mg/dL — ABNORMAL LOW (ref >39)
LDL Cholesterol: 110 mg/dL — ABNORMAL HIGH (ref 0–99)
Total CHOL/HDL Ratio: 5.8 RATIO
Triglycerides: 97 mg/dL (ref <150)
VLDL: 19 mg/dL (ref 0–40)

## 2013-05-10 LAB — CBC
HCT: 38.7 % (ref 36.0–46.0)
Hemoglobin: 13.1 g/dL (ref 12.0–15.0)
MCH: 32.5 pg (ref 26.0–34.0)
MCHC: 33.9 g/dL (ref 30.0–36.0)
MCV: 96 fL (ref 78.0–100.0)
PLATELETS: 242 10*3/uL (ref 150–400)
RBC: 4.03 MIL/uL (ref 3.87–5.11)
RDW: 11.6 % (ref 11.5–15.5)
WBC: 9.8 10*3/uL (ref 4.0–10.5)

## 2013-05-10 LAB — TSH: TSH: 4.26 u[IU]/mL (ref 0.350–4.500)

## 2013-05-10 LAB — CLOSTRIDIUM DIFFICILE BY PCR: Toxigenic C. Difficile by PCR: NEGATIVE

## 2013-05-10 LAB — ABO/RH: ABO/RH(D): A POS

## 2013-05-10 MED ORDER — PIPERACILLIN-TAZOBACTAM 3.375 G IVPB
3.3750 g | Freq: Three times a day (TID) | INTRAVENOUS | Status: DC
  Administered 2013-05-10 (×2): 3.375 g via INTRAVENOUS
  Filled 2013-05-10 (×3): qty 50

## 2013-05-10 MED ORDER — METOPROLOL TARTRATE 1 MG/ML IV SOLN
2.5000 mg | Freq: Two times a day (BID) | INTRAVENOUS | Status: DC | PRN
  Filled 2013-05-10: qty 5

## 2013-05-10 MED ORDER — SODIUM CHLORIDE 0.9 % IV BOLUS (SEPSIS): 500.0000 mL | Freq: Once | INTRAVENOUS | Status: AC | PRN

## 2013-05-10 NOTE — Progress Notes (Signed)
INITIAL NUTRITION ASSESSMENT  DOCUMENTATION CODES Per approved criteria  -Not Applicable   INTERVENTION: - Diet advancement per MD - Recommend MD replace pt's low potassium - Will continue to monitor   NUTRITION DIAGNOSIS: Inadequate oral intake related to inability to eat as evidenced by NPO.   Goal: Advance diet as tolerated to regular diet   Monitor:  Weights, labs, diet advancement  Reason for Assessment: Malnutrition screening tool   73 y.o. female  Admitting Dx: Colitis  ASSESSMENT: Pt with hx significant for tobacco abuse, HLD, known AAA (w/o mentioned of rupture or repair), CAD/MI in the past, GERD and HTN; who presented to ED with 3 weeks hx of on/off abd pain. Patient reports pain is worse with food intake and associated with nausea, vomiting, diarrhea initially and then maroon color stools. Seen by GI today who suspects CT scan may represent ischemic colitis.   Met with pt who reports she has been living off of Gatorade, ginger ale, jello, and Ensure for the past 3 weeks. Suspects she's lost 4-5 pounds during this time frame. Only solid food she tried to eat was some baked flounder with fries and a chicken pot pie. States vomiting and diarrhea would occur on/off and denies any of those symptoms today. Reports abdominal pain is improved.   Potassium low  Nutrition Focused Physical Exam:  Subcutaneous Fat:  Orbital Region: WNL Upper Arm Region: WNL Thoracic and Lumbar Region: NA  Muscle:  Temple Region: WNL Clavicle Bone Region: WNL Clavicle and Acromion Bone Region: WNL Scapular Bone Region: NA Dorsal Hand: WNL Patellar Region: WNL Anterior Thigh Region: WNL Posterior Calf Region: NA  Edema: none noted   Height: Ht Readings from Last 1 Encounters:  05/09/13 5' (1.524 m)    Weight: Wt Readings from Last 1 Encounters:  05/10/13 130 lb 4.7 oz (59.1 kg)    Ideal Body Weight: 100 lbs  % Ideal Body Weight: 130%  Wt Readings from Last 10  Encounters:  05/10/13 130 lb 4.7 oz (59.1 kg)    Usual Body Weight: 128 lbs per pt  % Usual Body Weight: 101%  BMI:  Body mass index is 25.45 kg/(m^2).  Estimated Nutritional Needs: Kcal: 1200-1400 Protein: 60-75g Fluid: 1.2-1.4L/day  Skin: Intact   Diet Order: NPO  EDUCATION NEEDS: -No education needs identified at this time   Intake/Output Summary (Last 24 hours) at 05/10/13 1334 Last data filed at 05/10/13 0600  Gross per 24 hour  Intake   1700 ml  Output   1150 ml  Net    550 ml    Last BM: 4/28  Labs:   Recent Labs Lab 05/09/13 1420 05/09/13 2250 05/10/13 0322  NA 135*  --  136*  K 3.8  --  3.3*  CL 89*  --  97  CO2 32  --  28  BUN 14  --  9  CREATININE 0.80  --  0.82  CALCIUM 10.5  --  8.8  MG  --  1.7  --   PHOS  --  3.3  --   GLUCOSE 118*  --  92    CBG (last 3)  No results found for this basename: GLUCAP,  in the last 72 hours  Scheduled Meds: . famotidine (PEPCID) IV  40 mg Intravenous Q24H  . nicotine  14 mg Transdermal Daily  . piperacillin-tazobactam (ZOSYN)  IV  3.375 g Intravenous 3 times per day    Continuous Infusions: . sodium chloride 100 mL/hr at 05/09/13 2239  Past Medical History  Diagnosis Date  . Hypertension   . MI (myocardial infarction)   . AAA (abdominal aortic aneurysm)   . Macular degeneration     in left eye    Past Surgical History  Procedure Laterality Date  . Cholecystectomy    . Hernia repair    . Stent in left leg      Mikey College MS, [place], LDN [phone] Pager [phone] After Hours Pager

## 2013-05-10 NOTE — Progress Notes (Signed)
Patient ID: Evelyn Miller, female   DOB: [birth year], 73 y.o.   MRN: [id number]  [facility] - [facility] Note  HD# 2  Subjective: Patient comfortable in stepdown unit.  Alert and responsive.  No complaints.  Denies abdominal pain.  One soft BM overnight per patient.  Objective: Vital signs in last 24 hours: Temp:  [97.8 F (36.6 C)-98.2 F (36.8 C)] 97.8 F (36.6 C) (04/29 0400) Pulse Rate:  [54-88] 54 (04/29 0500) Resp:  [15-20] 16 (04/29 0400) BP: (89-181)/(39-117) 133/51 mmHg (04/29 0500) SpO2:  [88 %-100 %] 98 % (04/29 0400) Weight:  [125 lb 14.1 oz (57.1 kg)-130 lb 4.7 oz (59.1 kg)] 130 lb 4.7 oz (59.1 kg) (04/29 0400) Last BM Date: 05/09/13  Intake/Output from previous day: 04/28 0701 - 04/29 0700 In: 1700 [I.V.:1550; IV Piggyback:150] Out: 1150 [Urine:1050; Stool:100]  Exam: HEENT - clear, not icteric Neck - soft Chest - clear bilaterally Cor - RRR, no murmur Abd - soft without distension; no tenderness; no mass Ext - no significant edema Neuro - grossly intact, no focal deficits  Lab Results:   Recent Labs  05/09/13 1420 05/10/13 0322  WBC 15.5* 9.8  HGB 16.6* 13.1  HCT 48.2* 38.7  PLT 313 242     Recent Labs  05/09/13 1420 05/10/13 0322  NA 135* 136*  K 3.8 3.3*  CL 89* 97  CO2 32 28  GLUCOSE 118* 92  BUN 14 9  CREATININE 0.80 0.82  CALCIUM 10.5 8.8    Studies/Results: Ct Abdomen Pelvis W Contrast  05/09/2013   CLINICAL DATA:  Chest pain, lower abdominal pain.  EXAM: CT ABDOMEN AND PELVIS WITH CONTRAST  TECHNIQUE: Multidetector CT imaging of the abdomen and pelvis was performed using the standard protocol following bolus administration of intravenous contrast.  CONTRAST:  50mL OMNIPAQUE IOHEXOL 300 MG/ML SOLN, 100mL OMNIPAQUE IOHEXOL 300 MG/ML SOLN  COMPARISON:  CT ABD-PELV W/ CM dated 05/02/2013  FINDINGS: Lung bases are clear.  No pericardial fluid.  No focal hepatic lesion. Post cholecystectomy. Common bile  duct is dilated to the level ampulla measuring 13 mm. This is not changed in short interval follow-up. Pancreas is normal without evidence of pancreatic duct dilatation. No intrahepatic duct dilatation of significance.  The spleen, adrenal glands, and kidneys are normal.  The stomach, small bowel, appendix, and cecum are normal. There is mild bowel wall thickening involving the descending colon over a long segment extending from the splenic flexure to the sigmoid colon (axial image 56 and coronal image 50 by for example. There is minimal diverticular disease through this region. Rectum is normal.  Abdominal aorta is aneurysmal measuring 4.8 x 4.5 cm in axial dimension unchanged from prior. Aneurysm dilatation of the suprarenal aorta measures 5.5 cm. The SMA is not diminutive seen on sagittal image 60. The IMA is not well appreciated.  No retroperitoneal periportal lymphadenopathy. No free fluid the pelvis. Hysterectomy. The bladder is normal. The uterus is normal. No aggressive osseous lesion. Process in the sacrum noted.  IMPRESSION: 1. Long segment of bowel wall inflammation involving the descending colon is new from scan 1 week prior. This finding is consistent with segmental colitis. Concern for ischemic colitis in this watershed location. The SMA is diminutive extending from the aneurysmal aorta. Secondary differentials would include infectious or inflammatory colitis including inflammatory bowel disease. 2. The SMA is diminutive and the IMA is difficult to define. The celiac trunk is the most robust branch vessel. 3. Stable severe  aneurysmal dilatation of the infrarenal and suprarenal abdominal aorta.   Electronically Signed   By: Genevive BiStewart  Edmunds M.D.   On: 05/09/2013 16:12   Dg Chest Portable 1 View  05/09/2013   CLINICAL DATA:  Pain  EXAM: PORTABLE CHEST - 1 VIEW  COMPARISON:  December 02, 2007  FINDINGS: There is underlying emphysematous change. There is no edema or consolidation. Heart is mildly  enlarged with pulmonary vascularity within normal limits. Aorta is tortuous and rather prominent but stable. No adenopathy. No bone lesions.  IMPRESSION: Underlying emphysematous change. No edema or consolidation. Heart prominent but stable. Aorta tortuous but stable.   Electronically Signed   By: Bretta BangWilliam  Woodruff M.D.   On: 05/09/2013 16:20    Assessment / Plan: 1.  Ischemic colitis involving descending colon  Clinical stable at present  NPO except for ice chips  GI to evaluate this AM  Will follow with you  Velora Hecklerodd M. Dajane Valli, MD, [facility] Office: [phone]  05/10/2013

## 2013-05-10 NOTE — Progress Notes (Signed)
[facility] PROGRESS NOTE  Evelyn Madeiraorma Miller ZOX:[id number] DOB: August 23, 1940 DOA: 05/09/2013 PCP: Angelica ChessmanAGUIAR,RAFAELA M., MD  Assessment/Plan: 1-Colitis:  -Admit to step down  -IV zosyn empirically for overlapping on infectious colitis and/or translocation (discussed with GI and in agreement)  -Check C. Diff by PCR and GI pathogen panel  -NPO status for bowel rest  -Avoid hypotension (hold home antihypertensive oral regimen)  -PRN analgesics and antiemetics  -IVF's and supportive care  2-GI bleeding: most likely secondary to ischemic colitis; other considerations includes diverticulosis. No colonoscopy on records.  -will provide supportive care  -follow Hgb trend  -type and screen  -NPO  -GI has been consulted and will follow rec's  3-Nausea with vomiting: secondary to #1.  -will use PRN antiemetics  4-Leukocytosis, unspecified: stress demargination vs infectious colitis.  -as mentioned above will start empirically iv zosyn.  -follow C. Diff and GI pathogen panel  5-Dehydration and mild hyponatremia: will provide IVF's resuscitation  6-Tobacco abuse: counseling cessation provided.  -nicotine patch ordered  7-Obstructive chronic bronchitis without exacerbation: due to tobacco abuse most likely.  -no fever, wheezing or productive cough currently  -will continue PRN albuterol  8-HLD (hyperlipidemia): with statins intolerance. Will check lipid profile, if need will add gemfibrozil/fish oil  9-GERD (gastroesophageal reflux disease): will use IV pepcid while r/o C. Diff.  -plan is to resume PPI if neg C. Diff  10-HTN (hypertension): will hold PO antihypertensive agent initially due to NPO status  -will start lopressor 2.5 Q12HR PRN. and follow VS  -main goal is avoid Hypotension, while keeping BP not to high and prevent rebound tachycardia  11-AAA (abdominal aortic aneurysm) without rupture: stable by CT scan. Patient will continue follow up with vascular surgery as an outpatient. ([facility]   HYpokalemia; replete as needed.  DVT: SCD's   Code Status:DNR Family Communication: NONE AT BEDSIDE Disposition Plan: pending.    Consultants:  GI  SURGERY  Procedures:  CT abdomen and pelvis.   Antibiotics:  Iv ZOSYN  IV cipro and flagyl 4/28  HPI/Subjective: Comfortable, occasional abd cramping. 2 bm TODAY.  RN reported rash and breaking out on starting cipro, hence discontinued and started on IV zosyn. i do not see any appreciable rash or break outs on the arm or neck.   Objective: Filed Vitals:   05/10/13 0500  BP: 133/51  Pulse: 54  Temp:   Resp:     Intake/Output Summary (Last 24 hours) at 05/10/13 0833 Last data filed at 05/10/13 0600  Gross per 24 hour  Intake   1700 ml  Output   1150 ml  Net    550 ml   Filed Weights   05/09/13 2052 05/10/13 0400  Weight: 57.1 kg (125 lb 14.1 oz) 59.1 kg (130 lb 4.7 oz)    Exam:   General:  Alert afebrile comfortable  Cardiovascular: s1s2, RRR  Respiratory: CTabno wheezing.   Abdomen: soft NT nd decreased BS  Musculoskeletal: no pedal edema.   Data Reviewed: Basic Metabolic Panel:  Recent Labs Lab 05/09/13 1420 05/09/13 2250 05/10/13 0322  NA 135*  --  136*  K 3.8  --  3.3*  CL 89*  --  97  CO2 32  --  28  GLUCOSE 118*  --  92  BUN 14  --  9  CREATININE 0.80  --  0.82  CALCIUM 10.5  --  8.8  MG  --  1.7  --   PHOS  --  3.3  --  Liver Function Tests:  Recent Labs Lab 05/09/13 1420  AST 22  ALT 18  ALKPHOS 97  BILITOT 0.5  PROT 8.1  ALBUMIN 4.0    Recent Labs Lab 05/09/13 1420  LIPASE 33   No results found for this basename: AMMONIA,  in the last 168 hours CBC:  Recent Labs Lab 05/09/13 1420 05/10/13 0322  WBC 15.5* 9.8  NEUTROABS 11.9*  --   HGB 16.6* 13.1  HCT 48.2* 38.7  MCV 96.8 96.0  PLT 313 242   Cardiac Enzymes: No results found for this basename: CKTOTAL, CKMB, CKMBINDEX, TROPONINI,  in the last 168 hours BNP (last 3 results) No  results found for this basename: PROBNP,  in the last 8760 hours CBG: No results found for this basename: GLUCAP,  in the last 168 hours  Recent Results (from the past 240 hour(s))  MRSA PCR SCREENING     Status: None   Collection Time    05/09/13  8:54 PM      Result Value Ref Range Status   MRSA by PCR NEGATIVE  NEGATIVE Final   Comment:            The GeneXpert MRSA Assay (FDA     approved for NASAL specimens     only), is one component of a     comprehensive MRSA colonization     surveillance program. It is not     intended to diagnose MRSA     infection nor to guide or     monitor treatment for     MRSA infections.  CLOSTRIDIUM DIFFICILE BY PCR     Status: None   Collection Time    05/09/13  9:39 PM      Result Value Ref Range Status   C difficile by pcr NEGATIVE  NEGATIVE Final   Comment: Performed at [facility]     Studies: Ct Abdomen Pelvis W Contrast  05/09/2013   CLINICAL DATA:  Chest pain, lower abdominal pain.  EXAM: CT ABDOMEN AND PELVIS WITH CONTRAST  TECHNIQUE: Multidetector CT imaging of the abdomen and pelvis was performed using the standard protocol following bolus administration of intravenous contrast.  CONTRAST:  50mL OMNIPAQUE IOHEXOL 300 MG/ML SOLN, OMNIPAQUE IOHEXOL 300 MG/ML SOLN  COMPARISON:  CT ABD-PELV W/ CM dated 05/02/2013  FINDINGS: Lung bases are clear.  No pericardial fluid.  No focal hepatic lesion. Post cholecystectomy. Common bile duct is dilated to the level ampulla measuring 13 mm. This is not changed in short interval follow-up. Pancreas is normal without evidence of pancreatic duct dilatation. No intrahepatic duct dilatation of significance.  The spleen, adrenal glands, and kidneys are normal.  The stomach, small bowel, appendix, and cecum are normal. There is mild bowel wall thickening involving the descending colon over a long segment extending from the splenic flexure to the sigmoid colon (axial image 56 and coronal image 50 by  for example. There is minimal diverticular disease through this region. Rectum is normal.  Abdominal aorta is aneurysmal measuring 4.8 x 4.5 cm in axial dimension unchanged from prior. Aneurysm dilatation of the suprarenal aorta measures 5.5 cm. The SMA is not diminutive seen on sagittal image 60. The IMA is not well appreciated.  No retroperitoneal periportal lymphadenopathy. No free fluid the pelvis. Hysterectomy. The bladder is normal. The uterus is normal. No aggressive osseous lesion. Process in the sacrum noted.  IMPRESSION: 1. Long segment of bowel wall inflammation involving the descending colon is new from scan 1  week prior. This finding is consistent with segmental colitis. Concern for ischemic colitis in this watershed location. The SMA is diminutive extending from the aneurysmal aorta. Secondary differentials would include infectious or inflammatory colitis including inflammatory bowel disease. 2. The SMA is diminutive and the IMA is difficult to define. The celiac trunk is the most robust branch vessel. 3. Stable severe aneurysmal dilatation of the infrarenal and suprarenal abdominal aorta.   Electronically Signed   By: Genevive BiStewart  Edmunds M.D.   On: 05/09/2013 16:12   Dg Chest Portable 1 View  05/09/2013   CLINICAL DATA:  Pain  EXAM: PORTABLE CHEST - 1 VIEW  COMPARISON:  December 02, 2007  FINDINGS: There is underlying emphysematous change. There is no edema or consolidation. Heart is mildly enlarged with pulmonary vascularity within normal limits. Aorta is tortuous and rather prominent but stable. No adenopathy. No bone lesions.  IMPRESSION: Underlying emphysematous change. No edema or consolidation. Heart prominent but stable. Aorta tortuous but stable.   Electronically Signed   By: Bretta BangWilliam  Woodruff M.D.   On: 05/09/2013 16:20    Scheduled Meds: . ciprofloxacin  400 mg Intravenous Q12H  . famotidine (PEPCID) IV  40 mg Intravenous Q24H  . metoprolol  2.5 mg Intravenous Q12H  . metronidazole   500 mg Intravenous Q8H  . nicotine  14 mg Transdermal Daily   Continuous Infusions: . sodium chloride 100 mL/hr at 05/09/13 2239    Principal Problem:   Colitis Active Problems:   GI bleeding   Nausea with vomiting   Leukocytosis, unspecified   Dehydration   Hyponatremia   Tobacco abuse   Obstructive chronic bronchitis without exacerbation   HLD (hyperlipidemia)   GERD (gastroesophageal reflux disease)   HTN (hypertension)   AAA (abdominal aortic aneurysm) without rupture    Time spent: 35  Min     Kathlen ModyVijaya Happy [place]  [facility] Pager [phone]. If 7PM-7AM, please contact night-coverage at [email or web address], password [email or web address] 05/10/2013, 8:33 AM  LOS: 1 day

## 2013-05-10 NOTE — Consult Note (Addendum)
Consultation  Referring Provider:  [facility]  (Dr. Gwenlyn PerkingMadera)  Primary Care Physician:  Angelica ChessmanAGUIAR,RAFAELA M., MD Primary Gastroenterologist: none        Reason for Consultation:  colitis            HPI:   Evelyn Miller is a 73 y.o. female admitted with abdominal pain,diarrhea and episode of hematochezia. Approximately three weeks ago patient began having lower abdominal discomfort, nausea and loose stools. She saw PCP, was given anti-emetics, pain meds and sent for CTscan. Patient recalls being told that CTscan was normal except for some "swelling around an ovary" for which she is to be referred to GYN. Symptoms persisted and patient went to [facility] yesterday where WBC was elevated, CTscan with contrast revealed a long segment of bowel wall inflammation involving the descending  colon. [facility] patient passed some blood rectally. No bleeding prior to, or since then. No chronic GI problems. BMs completely normal prior to three weeks ago. No unusual weight loss. She had a Zpak 2 months ago   Past Medical History  Diagnosis Date  . Hypertension   . MI (myocardial infarction)   . AAA (abdominal aortic aneurysm)   . Macular degeneration     in left eye    Past Surgical History  Procedure Laterality Date  . Cholecystectomy    . Hernia repair    . Stent in left leg      FMH: No colon cancer or gastrointestinal illnesses  History  Substance Use Topics  . Smoking status: Current Every Day Smoker -- 1.00 packs/day for 60 years    Types: Cigarettes  . Smokeless tobacco: Never Used  . Alcohol Use: Yes     Comment: occasional    Prior to Admission medications   Medication Sig Start Date End Date Taking? Authorizing Provider  aspirin 81 MG tablet Take 81 mg by mouth daily.   Yes Historical Provider, MD  losartan-hydrochlorothiazide (HYZAAR) 100-25 MG per tablet Take 1 tablet by mouth daily.   Yes Historical Provider, MD  omeprazole (PRILOSEC) 40 MG capsule  Take 40 mg by mouth daily.   Yes Historical Provider, MD  ondansetron (ZOFRAN) 4 MG tablet Take 4 mg by mouth every 12 (twelve) hours as needed for nausea or vomiting.   Yes Historical Provider, MD  PRESCRIPTION MEDICATION Place 1 application in ear(s) 3 (three) times a week. Ear itching   Yes Historical Provider, MD  propranolol (INDERAL) 80 MG tablet Take 80 mg by mouth daily.    Yes Historical Provider, MD    Current Facility-Administered Medications  Medication Dose Route Frequency Provider Last Rate Last Dose  . 0.9 %  sodium chloride infusion   Intravenous Continuous Vassie Lollarlos Madera, MD 100 mL/hr at 05/09/13 2239    . acetaminophen (TYLENOL) tablet 650 mg  650 mg Oral Q6H PRN Vassie Lollarlos Madera, MD   650 mg at 05/09/13 2251   Or  . acetaminophen (TYLENOL) suppository 650 mg  650 mg Rectal Q6H PRN Vassie Lollarlos Madera, MD      . albuterol (PROVENTIL) (2.5 MG/3ML) 0.083% nebulizer solution 2.5 mg  2.5 mg Nebulization Q4H PRN Vassie Lollarlos Madera, MD      . famotidine (PEPCID) 40 mg in sodium chloride 0.9 % 50 mL IVPB  40 mg Intravenous Q24H Vassie Lollarlos Madera, MD   40 mg at 05/09/13 2239  . metoprolol (LOPRESSOR) injection 2.5 mg  2.5 mg Intravenous Q12H PRN Kathlen ModyVijaya Akula, MD      .  morphine 2 MG/ML injection 1-2 mg  1-2 mg Intravenous Q3H PRN Vassie Lollarlos Madera, MD      . nicotine (NICODERM CQ - dosed in mg/24 hours) patch 14 mg  14 mg Transdermal Daily Vassie Lollarlos Madera, MD      . ondansetron [facility](ZOFRAN) tablet 4 mg  4 mg Oral Q6H PRN Vassie Lollarlos Madera, MD       Or  . ondansetron [facility](ZOFRAN) injection 4 mg  4 mg Intravenous Q6H PRN Vassie Lollarlos Madera, MD      . piperacillin-tazobactam (ZOSYN) IVPB 3.375 g  3.375 g Intravenous 3 times per day Milus GlazierLaura S Moran, RPH   3.375 g at 05/10/13 1002  . sodium chloride 0.9 % bolus 500 mL  500 mL Intravenous Once PRN Leda GauzeKaren J Kirby-Graham, NP        Allergies as of 05/09/2013 - Review Complete 05/09/2013  Allergen Reaction Noted  . Other  05/09/2013  . Codeine  05/09/2013    Review of Systems:      All systems reviewed and negative except where noted in HPI.   Physical Exam:  Vital signs in last 24 hours: Temp:  [97.8 F (36.6 C)-98.2 F (36.8 C)] 98.2 F (36.8 C) (04/29 0800) Pulse Rate:  [54-88] 54 (04/29 0500) Resp:  [15-20] 16 (04/29 0400) BP: (89-181)/(39-117) 133/51 mmHg (04/29 0500) SpO2:  [88 %-100 %] 98 % (04/29 0400) Weight:  [125 lb 14.1 oz (57.1 kg)-130 lb 4.7 oz (59.1 kg)] 130 lb 4.7 oz (59.1 kg) (04/29 0400) Last BM Date: 05/09/13 General:   Pleasant white female in NAD Head:  Normocephalic and atraumatic. Eyes:   No icterus.   Conjunctiva pink. Ears:  Normal auditory acuity. Neck:  Supple; no masses felt Lungs:  Respirations even and unlabored. Lungs clear to auscultation bilaterally.   No wheezes, crackles, or rhonchi.  Heart:  Regular rate and rhythm Abdomen:  Soft, nondistended, mild diffuse tenderness.  Normal bowel sounds. No appreciable masses or hepatomegaly.  Rectal:  No blood in vault. Scant fecal stained liquid.  Msk:  Symmetrical without gross deformities.  Extremities:  Without edema. Neurologic:  Alert and  oriented x4;  grossly normal neurologically. Skin:  Intact without significant lesions or rashes. Cervical Nodes:  No significant cervical adenopathy. Psych:  Alert and cooperative. Normal affect.  LAB RESULTS:  Recent Labs  05/09/13 1420 05/10/13 0322  WBC 15.5* 9.8  HGB 16.6* 13.1  HCT 48.2* 38.7  PLT 313 242   BMET  Recent Labs  05/09/13 1420 05/10/13 0322  NA 135* 136*  K 3.8 3.3*  CL 89* 97  CO2 32 28  GLUCOSE 118* 92  BUN 14 9  CREATININE 0.80 0.82  CALCIUM 10.5 8.8   LFT  Recent Labs  05/09/13 1420  PROT 8.1  ALBUMIN 4.0  AST 22  ALT 18  ALKPHOS 97  BILITOT 0.5   PT/INR  Recent Labs  05/09/13 1730  LABPROT 12.8  INR 0.98    STUDIES: Ct Abdomen Pelvis W Contrast  05/09/2013   CLINICAL DATA:  Chest pain, lower abdominal pain.  EXAM: CT ABDOMEN AND PELVIS WITH CONTRAST  TECHNIQUE: Multidetector  CT imaging of the abdomen and pelvis was performed using the standard protocol following bolus administration of intravenous contrast.  CONTRAST:  50mL OMNIPAQUE IOHEXOL 300 MG/ML SOLN, 100mL OMNIPAQUE IOHEXOL 300 MG/ML SOLN  COMPARISON:  CT ABD-PELV W/ CM dated 05/02/2013  FINDINGS: Lung bases are clear.  No pericardial fluid.  No focal hepatic lesion. Post cholecystectomy. Common bile duct is  dilated to the level ampulla measuring 13 mm. This is not changed in short interval follow-up. Pancreas is normal without evidence of pancreatic duct dilatation. No intrahepatic duct dilatation of significance.  The spleen, adrenal glands, and kidneys are normal.  The stomach, small bowel, appendix, and cecum are normal. There is mild bowel wall thickening involving the descending colon over a long segment extending from the splenic flexure to the sigmoid colon (axial image 56 and coronal image 50 by for example. There is minimal diverticular disease through this region. Rectum is normal.  Abdominal aorta is aneurysmal measuring 4.8 x 4.5 cm in axial dimension unchanged from prior. Aneurysm dilatation of the suprarenal aorta measures 5.5 cm. The SMA is not diminutive seen on sagittal image 60. The IMA is not well appreciated.  No retroperitoneal periportal lymphadenopathy. No free fluid the pelvis. Hysterectomy. The bladder is normal. The uterus is normal. No aggressive osseous lesion. Process in the sacrum noted.  IMPRESSION: 1. Long segment of bowel wall inflammation involving the descending colon is new from scan 1 week prior. This finding is consistent with segmental colitis. Concern for ischemic colitis in this watershed location. The SMA is diminutive extending from the aneurysmal aorta. Secondary differentials would include infectious or inflammatory colitis including inflammatory bowel disease. 2. The SMA is diminutive and the IMA is difficult to define. The celiac trunk is the most robust branch vessel. 3. Stable  severe aneurysmal dilatation of the infrarenal and suprarenal abdominal aorta.   Electronically Signed   By: Genevive Bi M.D.   On: 05/09/2013 16:12   PREVIOUS ENDOSCOPIES:            none   Impression / Plan:   13. 73 year old female with three week history of lower abdominal discomfort, nausea and loose stool. She had an isolated episode of rectal bleeding yesterday. Symptoms in setting of CTscan revealing long segment of descending colon inflammation. IMA unable to be defined on scan. C-diff negative, GI pathogen panel pending. CT scan findings may represent ischemic colitis. It is concerning that pain and bowel changes have been present for 3 weeks. Awaiting remaining stool studies. At some point she will likely need colonoscopy for further evaluation. Surgery has evaluated.  2. Tobacco abuse  3. CBD dilation. Common bile duct is dilated to the level ampulla measuring 13 mm. Post-cholecystectomy. Normal LFTs. Normal appearing pancreas / PD duct. Finding may be non-specific  4. CAD / HTN / PVD. She has a large AAA aneurysm on CTscan. Followed by Vascular group in [place].   Thanks   LOS: 1 day   Meredith Pel  05/10/2013, 10:48 AM    [facility] Attending  I have also seen and assessed the patient and agree with the above note. She has a classic picture for ischemic colitis - usu from small vessel intramucosal ischemia but in her case ? Related to a (possibly) bad IMA  Will do a flex sig tomorrow.  The risks and benefits as well as alternatives of endoscopic procedure(s) have been discussed and reviewed. All questions answered. The patient agrees to proceed.  I stopped Zosyn - this is not an infection in my opinion.  She looks ready for floor  Iva Boop, MD, [facility] [phone] (pager) 05/10/2013 6:26 PM

## 2013-05-11 ENCOUNTER — Encounter (HOSPITAL_COMMUNITY)
Admission: EM | Disposition: A | Discharge status: Home / Self Care | Payer: Self-pay | Source: Home / Self Care | Attending: Internal Medicine

## 2013-05-11 ENCOUNTER — Encounter (HOSPITAL_COMMUNITY): Payer: Self-pay | Admitting: *Deleted

## 2013-05-11 DIAGNOSIS — K559 Vascular disorder of intestine, unspecified: Principal | ICD-10-CM

## 2013-05-11 HISTORY — PX: FLEXIBLE SIGMOIDOSCOPY: SHX5431

## 2013-05-11 LAB — BASIC METABOLIC PANEL
BUN: 9 mg/dL (ref 6–23)
CO2: 26 mEq/L (ref 19–32)
CREATININE: 0.85 mg/dL (ref 0.50–1.10)
Calcium: 8.8 mg/dL (ref 8.4–10.5)
Chloride: 98 mEq/L (ref 96–112)
GFR, EST AFRICAN AMERICAN: 76 mL/min — AB (ref >90)
GFR, EST NON AFRICAN AMERICAN: 65 mL/min — AB (ref >90)
Glucose, Bld: 78 mg/dL (ref 70–99)
Potassium: 3.1 mEq/L — ABNORMAL LOW (ref 3.7–5.3)
Sodium: 137 mEq/L (ref 137–147)

## 2013-05-11 LAB — URINE CULTURE: SPECIAL REQUESTS: NORMAL

## 2013-05-11 SURGERY — SIGMOIDOSCOPY, FLEXIBLE
Anesthesia: Moderate Sedation

## 2013-05-11 MED ORDER — FENTANYL CITRATE 0.05 MG/ML IJ SOLN
INTRAMUSCULAR | Status: DC | PRN
  Administered 2013-05-11 (×2): 25 ug via INTRAVENOUS

## 2013-05-11 MED ORDER — MIDAZOLAM HCL 10 MG/2ML IJ SOLN
INTRAMUSCULAR | Status: DC | PRN
  Administered 2013-05-11: 2 mg via INTRAVENOUS
  Administered 2013-05-11: 1 mg via INTRAVENOUS

## 2013-05-11 MED ORDER — MIDAZOLAM HCL 10 MG/2ML IJ SOLN
INTRAMUSCULAR | Status: AC
  Filled 2013-05-11: qty 2

## 2013-05-11 MED ORDER — FENTANYL CITRATE 0.05 MG/ML IJ SOLN
INTRAMUSCULAR | Status: AC
  Filled 2013-05-11: qty 2

## 2013-05-11 MED ORDER — DICYCLOMINE HCL 10 MG PO CAPS
10.0000 mg | ORAL_CAPSULE | Freq: Four times a day (QID) | ORAL | Status: DC | PRN
  Filled 2013-05-11: qty 1

## 2013-05-11 MED ORDER — POTASSIUM CHLORIDE CRYS ER 20 MEQ PO TBCR
40.0000 meq | EXTENDED_RELEASE_TABLET | Freq: Two times a day (BID) | ORAL | Status: AC
  Administered 2013-05-11 – 2013-05-12 (×2): 40 meq via ORAL
  Filled 2013-05-11 (×2): qty 2

## 2013-05-11 NOTE — Interval H&P Note (Signed)
History and Physical Interval Note:  05/11/2013 12:43 PM  Evelyn Miller  has presented today for surgery, with the diagnosis of colitis  The various methods of treatment have been discussed with the patient and family. After consideration of risks, benefits and other options for treatment, the patient has consented to  Procedure(s): FLEXIBLE SIGMOIDOSCOPY (N/A) as a surgical intervention .  The patient's history has been reviewed, patient examined, no change in status, stable for surgery.  I have reviewed the patient's chart and labs.  Questions were answered to the patient's satisfaction.     Evelyn Miller E Gessner

## 2013-05-11 NOTE — Progress Notes (Signed)
Patient ID: Janene Madeiraorma Kwolek, female   DOB: 1940/10/13, 73 y.o.   MRN: [id number]  [facility] Progress Note  Subjective: Patient back from endoscopy.  Results noted.  Diet to be advanced.  Objective: Vital signs in last 24 hours: Temp:  [97.6 F (36.4 C)-98.8 F (37.1 C)] 97.6 F (36.4 C) (04/30 1329) Pulse Rate:  [33-107] 73 (04/30 1329) Resp:  [12-22] 18 (04/30 1329) BP: (121-195)/(59-99) 148/61 mmHg (04/30 1329) SpO2:  [90 %-100 %] 100 % (04/30 1329) FiO2 (%):  [96 %] 96 % (04/29 2100) Weight:  [126 lb 15.8 oz (57.6 kg)] 126 lb 15.8 oz (57.6 kg) (04/30 0400) Last BM Date: 05/11/13  Intake/Output from previous day: 04/29 0701 - 04/30 0700 In: 1154 [I.V.:1000; IV Piggyback:154] Out: 650 [Urine:650]  Exam: HEENT - clear, not icteric Neck - soft Chest - clear bilaterally Cor - RRR, no murmur Abd - soft without distension; non-tender; no mass Ext - no significant edema Neuro - grossly intact, no focal deficits  Lab Results:   Recent Labs  05/09/13 1420 05/10/13 0322  WBC 15.5* 9.8  HGB 16.6* 13.1  HCT 48.2* 38.7  PLT 313 242     Recent Labs  05/10/13 0322 05/11/13 0310  NA 136* 137  K 3.3* 3.1*  CL 97 98  CO2 28 26  GLUCOSE 92 78  BUN 9 9  CREATININE 0.82 0.85  CALCIUM 8.8 8.8    Studies/Results: Ct Abdomen Pelvis W Contrast  05/09/2013   CLINICAL DATA:  Chest pain, lower abdominal pain.  EXAM: CT ABDOMEN AND PELVIS WITH CONTRAST  TECHNIQUE: Multidetector CT imaging of the abdomen and pelvis was performed using the standard protocol following bolus administration of intravenous contrast.  CONTRAST:  50mL OMNIPAQUE IOHEXOL 300 MG/ML SOLN, 100mL OMNIPAQUE IOHEXOL 300 MG/ML SOLN  COMPARISON:  CT ABD-PELV W/ CM dated 05/02/2013  FINDINGS: Lung bases are clear.  No pericardial fluid.  No focal hepatic lesion. Post cholecystectomy. Common bile duct is dilated to the level ampulla measuring 13 mm. This is not changed in short  interval follow-up. Pancreas is normal without evidence of pancreatic duct dilatation. No intrahepatic duct dilatation of significance.  The spleen, adrenal glands, and kidneys are normal.  The stomach, small bowel, appendix, and cecum are normal. There is mild bowel wall thickening involving the descending colon over a long segment extending from the splenic flexure to the sigmoid colon (axial image 56 and coronal image 50 by for example. There is minimal diverticular disease through this region. Rectum is normal.  Abdominal aorta is aneurysmal measuring 4.8 x 4.5 cm in axial dimension unchanged from prior. Aneurysm dilatation of the suprarenal aorta measures 5.5 cm. The SMA is not diminutive seen on sagittal image 60. The IMA is not well appreciated.  No retroperitoneal periportal lymphadenopathy. No free fluid the pelvis. Hysterectomy. The bladder is normal. The uterus is normal. No aggressive osseous lesion. Process in the sacrum noted.  IMPRESSION: 1. Long segment of bowel wall inflammation involving the descending colon is new from scan 1 week prior. This finding is consistent with segmental colitis. Concern for ischemic colitis in this watershed location. The SMA is diminutive extending from the aneurysmal aorta. Secondary differentials would include infectious or inflammatory colitis including inflammatory bowel disease. 2. The SMA is diminutive and the IMA is difficult to define. The celiac trunk is the most robust branch vessel. 3. Stable severe aneurysmal dilatation of the infrarenal and suprarenal abdominal aorta.   Electronically  Signed   By: Genevive BiStewart  Edmunds M.D.   On: 05/09/2013 16:12   Dg Chest Portable 1 View  05/09/2013   CLINICAL DATA:  Pain  EXAM: PORTABLE CHEST - 1 VIEW  COMPARISON:  December 02, 2007  FINDINGS: There is underlying emphysematous change. There is no edema or consolidation. Heart is mildly enlarged with pulmonary vascularity within normal limits. Aorta is tortuous and rather  prominent but stable. No adenopathy. No bone lesions.  IMPRESSION: Underlying emphysematous change. No edema or consolidation. Heart prominent but stable. Aorta tortuous but stable.   Electronically Signed   By: Bretta BangWilliam  Woodruff M.D.   On: 05/09/2013 16:20    Assessment / Plan: 1.  Ischemic colitis  GI findings and recommendations noted  Will sign off  Agree with follow up with patient's vascular surgeon in near future  Call if needed  Velora Hecklerodd M. Shep Porter, MD, [facility] Office: [phone]  05/11/2013

## 2013-05-11 NOTE — Progress Notes (Signed)
Full report given to floor RN at 1621. Tech to transport pt now. No tele needed.

## 2013-05-11 NOTE — Progress Notes (Signed)
[facility] PROGRESS NOTE  Evelyn Miller ZOX:[id number] DOB: 11/04/1940 DOA: 05/09/2013 PCP: Angelica ChessmanAGUIAR,RAFAELA M., MD  Assessment/Plan: 1-Colitis:  -Admit to step down  -IV zosyn empirically for overlapping on infectious colitis and/or translocation (discussed with GI and in agreement)  c diff pcr and GI pathogen negative. Underwent sigmoidoscopy and was found to have edema, ulcers and erythema in the descending colon consistent with ischemic colitis. Biopsies taken and advance diet as tolerated.   2-GI bleeding: most likely secondary to ischemic colitis; other considerations includes diverticulosis. No colonoscopy on records.  Gi consulted and she underwent sigmoidoscopy was found to have ulcers and edema consistent with ischemic colitis. Biopsies were taken and she was recommended to follow up with vascular surgery as outpatient.   3-Nausea with vomiting: secondary to #1.  -will use PRN antiemetics  4-Leukocytosis, unspecified: stress demargination vs infectious colitis.  Resolved.    5-Dehydration and mild hyponatremia: resolved.   6-Tobacco abuse: counseling cessation provided.  -nicotine patch ordered   7-Obstructive chronic bronchitis without exacerbation: due to tobacco abuse most likely.  -no fever, wheezing or productive cough currently  -will continue PRN albuterol   8-HLD (hyperlipidemia): with statins intolerance.   9-GERD (gastroesophageal reflux disease): continue with protonix  10-HTN (hypertension):  Slightly high, will resume home medications.   11-AAA (abdominal aortic aneurysm) without rupture: stable by CT scan. Patient will continue follow up with vascular surgery as an outpatient. ([facility])   HYpokalemia; replete as needed.  DVT: SCD's   Code Status:DNR Family Communication: sister at bedside.  Disposition Plan: pending.    Consultants:  GI  SURGERY  Procedures:  CT abdomen and pelvis.   Antibiotics:  Iv ZOSYN 4/.29    IV cipro and flagyl 4/28  HPI/Subjective: NO ABD pain, no nausea or vomiting.   Objective: Filed Vitals:   05/11/13 0800  BP:   Pulse:   Temp: 98 F (36.7 C)  Resp:     Intake/Output Summary (Last 24 hours) at 05/11/13 1032 Last data filed at 05/11/13 [id number]  Gross per 24 hour  Intake   1104 ml  Output    652 ml  Net    452 ml   Filed Weights   05/09/13 2052 05/10/13 0400 05/11/13 0400  Weight: 57.1 kg (125 lb 14.1 oz) 59.1 kg (130 lb 4.7 oz) 57.6 kg (126 lb 15.8 oz)    Exam:   General:  Alert afebrile comfortable  Cardiovascular: s1s2, RRR  Respiratory: CTAB, NO wheezing.   Abdomen: soft NT nd BS+  Musculoskeletal: no pedal edema.   Data Reviewed: Basic Metabolic Panel:  Recent Labs Lab 05/09/13 1420 05/09/13 2250 05/10/13 0322 05/11/13 0310  NA 135*  --  136* 137  K 3.8  --  3.3* 3.1*  CL 89*  --  97 98  CO2 32  --  28 26  GLUCOSE 118*  --  92 78  BUN 14  --  9 9  CREATININE 0.80  --  0.82 0.85  CALCIUM 10.5  --  8.8 8.8  MG  --  1.7  --   --   PHOS  --  3.3  --   --    Liver Function Tests:  Recent Labs Lab 05/09/13 1420  AST 22  ALT 18  ALKPHOS 97  BILITOT 0.5  PROT 8.1  ALBUMIN 4.0    Recent Labs Lab 05/09/13 1420  LIPASE 33   No results found for this basename: AMMONIA,  in the last 168  hours CBC:  Recent Labs Lab 05/09/13 1420 05/10/13 0322  WBC 15.5* 9.8  NEUTROABS 11.9*  --   HGB 16.6* 13.1  HCT 48.2* 38.7  MCV 96.8 96.0  PLT 313 242   Cardiac Enzymes: No results found for this basename: CKTOTAL, CKMB, CKMBINDEX, TROPONINI,  in the last 168 hours BNP (last 3 results) No results found for this basename: PROBNP,  in the last 8760 hours CBG: No results found for this basename: GLUCAP,  in the last 168 hours  Recent Results (from the past 240 hour(s))  MRSA PCR SCREENING     Status: None   Collection Time    05/09/13  8:54 PM      Result Value Ref Range Status   MRSA by PCR NEGATIVE  NEGATIVE Final    Comment:            The GeneXpert MRSA Assay (FDA     approved for NASAL specimens     only), is one component of a     comprehensive MRSA colonization     surveillance program. It is not     intended to diagnose MRSA     infection nor to guide or     monitor treatment for     MRSA infections.  CLOSTRIDIUM DIFFICILE BY PCR     Status: None   Collection Time    05/09/13  9:39 PM      Result Value Ref Range Status   C difficile by pcr NEGATIVE  NEGATIVE Final   Comment: [facility]     Studies: Ct Abdomen Pelvis W Contrast  05/09/2013   CLINICAL DATA:  Chest pain, lower abdominal pain.  EXAM: CT ABDOMEN AND PELVIS WITH CONTRAST  TECHNIQUE: Multidetector CT imaging of the abdomen and pelvis was performed using the standard protocol following bolus administration of intravenous contrast.  CONTRAST:  50mL OMNIPAQUE IOHEXOL 300 MG/ML SOLN, 100mL OMNIPAQUE IOHEXOL 300 MG/ML SOLN  COMPARISON:  CT ABD-PELV W/ CM dated 05/02/2013  FINDINGS: Lung bases are clear.  No pericardial fluid.  No focal hepatic lesion. Post cholecystectomy. Common bile duct is dilated to the level ampulla measuring 13 mm. This is not changed in short interval follow-up. Pancreas is normal without evidence of pancreatic duct dilatation. No intrahepatic duct dilatation of significance.  The spleen, adrenal glands, and kidneys are normal.  The stomach, small bowel, appendix, and cecum are normal. There is mild bowel wall thickening involving the descending colon over a long segment extending from the splenic flexure to the sigmoid colon (axial image 56 and coronal image 50 by for example. There is minimal diverticular disease through this region. Rectum is normal.  Abdominal aorta is aneurysmal measuring 4.8 x 4.5 cm in axial dimension unchanged from prior. Aneurysm dilatation of the suprarenal aorta measures 5.5 cm. The SMA is not diminutive seen on sagittal image 60. The IMA is not well appreciated.  No  retroperitoneal periportal lymphadenopathy. No free fluid the pelvis. Hysterectomy. The bladder is normal. The uterus is normal. No aggressive osseous lesion. Process in the sacrum noted.  IMPRESSION: 1. Long segment of bowel wall inflammation involving the descending colon is new from scan 1 week prior. This finding is consistent with segmental colitis. Concern for ischemic colitis in this watershed location. The SMA is diminutive extending from the aneurysmal aorta. Secondary differentials would include infectious or inflammatory colitis including inflammatory bowel disease. 2. The SMA is diminutive and the IMA is difficult to define. The celiac  trunk is the most robust branch vessel. 3. Stable severe aneurysmal dilatation of the infrarenal and suprarenal abdominal aorta.   Electronically Signed   By: Genevive Bi M.D.   On: 05/09/2013 16:12   Dg Chest Portable 1 View  05/09/2013   CLINICAL DATA:  Pain  EXAM: PORTABLE CHEST - 1 VIEW  COMPARISON:  December 02, 2007  FINDINGS: There is underlying emphysematous change. There is no edema or consolidation. Heart is mildly enlarged with pulmonary vascularity within normal limits. Aorta is tortuous and rather prominent but stable. No adenopathy. No bone lesions.  IMPRESSION: Underlying emphysematous change. No edema or consolidation. Heart prominent but stable. Aorta tortuous but stable.   Electronically Signed   By: Bretta Bang M.D.   On: 05/09/2013 16:20    Scheduled Meds: . famotidine (PEPCID) IV  40 mg Intravenous Q24H  . nicotine  14 mg Transdermal Daily   Continuous Infusions: . sodium chloride 100 mL/hr at 05/11/13 0100    Principal Problem:   Colitis Active Problems:   GI bleeding   Nausea with vomiting   Leukocytosis, unspecified   Dehydration   Hyponatremia   Tobacco abuse   Obstructive chronic bronchitis without exacerbation   HLD (hyperlipidemia)   GERD (gastroesophageal reflux disease)   HTN (hypertension)   AAA  (abdominal aortic aneurysm) without rupture    Time spent: 35  Min     Kathlen Mody  [facility] Pager [phone]. If 7PM-7AM, please contact night-coverage at [email or web address], password [facility] 05/11/2013, 10:32 AM  LOS: 2 days

## 2013-05-11 NOTE — Op Note (Signed)
[facility] [address] [place], [id number]   FLEXIBLE SIGMOIDOSCOPY PROCEDURE REPORT  PATIENT: Evelyn Miller, Evelyn Miller  MR#: [id number] BIRTHDATE: 12/13/1940 , 73  yrs. old GENDER: Female ENDOSCOPIST: Iva Booparl E Gessner, MD, [facility] PROCEDURE DATE:  05/11/2013 PROCEDURE:   Sigmoidoscopy with biopsy ASA CLASS:   Class III INDICATIONS:colitis on CT. MEDICATIONS: Fentanyl 50 mcg IV and Versed 3 mg IV  DESCRIPTION OF PROCEDURE:   After the risks benefits and alternatives of the procedure were thoroughly explained, informed consent was obtained.  revealed no abnormalities of the rectum. The endoscope was introduced through the anus  and advanced to the descending colon , limited by No adverse events experienced.   The quality of the prep was adequate .  The instrument was then slowly withdrawn as the mucosa was fully examined.         COLON FINDINGS: Edema, ulcers, erythema in descending colon that looks like ischemic colitis.  Biopsies taken. Otherwise normal. Retroflexed views revealed no abnormalities.    The scope was then withdrawn from the patient and the procedure terminated.  COMPLICATIONS: There were no complications.  ENDOSCOPIC IMPRESSION: Edema, ulcers, erythema in descending colon that looks like ischemic colitis.  Biopsies taken. Otherwise normal  RECOMMENDATIONS: 1.  await biopsy results 2.  Full liquids + dicyclomine prn Move to floor As long as she progresses home 1-2 days - will need to see her vascular surgeon in f/u (Dr.  Wallis BambergKruze - [place])       eSigned:  Iva Booparl E Gessner, MD, [facility] 05/11/2013 1:21 PM

## 2013-05-11 NOTE — H&P (View-Only) (Signed)
Consultation  Referring Provider:  [facility]  (Dr. Gwenlyn PerkingMadera)  Primary Care Physician:  Angelica ChessmanAGUIAR,RAFAELA M., MD Primary Gastroenterologist: none        Reason for Consultation:  colitis            HPI:   Evelyn Miller is a 73 y.o. female admitted with abdominal pain,diarrhea and episode of hematochezia. Approximately three weeks ago patient began having lower abdominal discomfort, nausea and loose stools. She saw PCP, was given anti-emetics, pain meds and sent for CTscan. Patient recalls being told that CTscan was normal except for some "swelling around an ovary" for which she is to be referred to GYN. Symptoms persisted and patient went to [facility] yesterday where WBC was elevated, CTscan with contrast revealed a long segment of bowel wall inflammation involving the descending  colon. [facility] patient passed some blood rectally. No bleeding prior to, or since then. No chronic GI problems. BMs completely normal prior to three weeks ago. No unusual weight loss. She had a Zpak 2 months ago   Past Medical History  Diagnosis Date  . Hypertension   . MI (myocardial infarction)   . AAA (abdominal aortic aneurysm)   . Macular degeneration     in left eye    Past Surgical History  Procedure Laterality Date  . Cholecystectomy    . Hernia repair    . Stent in left leg      FMH: No colon cancer or gastrointestinal illnesses  History  Substance Use Topics  . Smoking status: Current Every Day Smoker -- 1.00 packs/day for 60 years    Types: Cigarettes  . Smokeless tobacco: Never Used  . Alcohol Use: Yes     Comment: occasional    Prior to Admission medications   Medication Sig Start Date End Date Taking? Authorizing Provider  aspirin 81 MG tablet Take 81 mg by mouth daily.   Yes Historical Provider, MD  losartan-hydrochlorothiazide (HYZAAR) 100-25 MG per tablet Take 1 tablet by mouth daily.   Yes Historical Provider, MD  omeprazole (PRILOSEC) 40 MG capsule  Take 40 mg by mouth daily.   Yes Historical Provider, MD  ondansetron (ZOFRAN) 4 MG tablet Take 4 mg by mouth every 12 (twelve) hours as needed for nausea or vomiting.   Yes Historical Provider, MD  PRESCRIPTION MEDICATION Place 1 application in ear(s) 3 (three) times a week. Ear itching   Yes Historical Provider, MD  propranolol (INDERAL) 80 MG tablet Take 80 mg by mouth daily.    Yes Historical Provider, MD    Current Facility-Administered Medications  Medication Dose Route Frequency Provider Last Rate Last Dose  . 0.9 %  sodium chloride infusion   Intravenous Continuous Vassie Lollarlos Madera, MD 100 mL/hr at 05/09/13 2239    . acetaminophen (TYLENOL) tablet 650 mg  650 mg Oral Q6H PRN Vassie Lollarlos [place], MD   650 mg at 05/09/13 2251   Or  . acetaminophen (TYLENOL) suppository 650 mg  650 mg Rectal Q6H PRN Vassie Lollarlos [place], MD      . albuterol (PROVENTIL) (2.5 MG/3ML) 0.083% nebulizer solution 2.5 mg  2.5 mg Nebulization Q4H PRN Vassie Lollarlos [place], MD      . famotidine (PEPCID) 40 mg in sodium chloride 0.9 % 50 mL IVPB  40 mg Intravenous Q24H Vassie Lollarlos [place], MD   40 mg at 05/09/13 2239  . metoprolol (LOPRESSOR) injection 2.5 mg  2.5 mg Intravenous Q12H PRN Kathlen ModyVijaya Akula, MD      .  morphine 2 MG/ML injection 1-2 mg  1-2 mg Intravenous Q3H PRN Vassie Lollarlos [place], MD      . nicotine (NICODERM CQ - dosed in mg/24 hours) patch 14 mg  14 mg Transdermal Daily Vassie Lollarlos Madera, MD      . ondansetron [facility](ZOFRAN) tablet 4 mg  4 mg Oral Q6H PRN Vassie Lollarlos Madera, MD       Or  . ondansetron [facility](ZOFRAN) injection 4 mg  4 mg Intravenous Q6H PRN Vassie Lollarlos Madera, MD      . piperacillin-tazobactam (ZOSYN) IVPB 3.375 g  3.375 g Intravenous 3 times per day Milus GlazierLaura S Moran, RPH   3.375 g at 05/10/13 1002  . sodium chloride 0.9 % bolus 500 mL  500 mL Intravenous Once PRN Leda GauzeKaren J Kirby-Graham, NP        Allergies as of 05/09/2013 - Review Complete 05/09/2013  Allergen Reaction Noted  . Other  05/09/2013  . Codeine  05/09/2013    Review of Systems:      All systems reviewed and negative except where noted in HPI.   Physical Exam:  Vital signs in last 24 hours: Temp:  [97.8 F (36.6 C)-98.2 F (36.8 C)] 98.2 F (36.8 C) (04/29 0800) Pulse Rate:  [54-88] 54 (04/29 0500) Resp:  [15-20] 16 (04/29 0400) BP: (89-181)/(39-117) 133/51 mmHg (04/29 0500) SpO2:  [88 %-100 %] 98 % (04/29 0400) Weight:  [125 lb 14.1 oz (57.1 kg)-130 lb 4.7 oz (59.1 kg)] 130 lb 4.7 oz (59.1 kg) (04/29 0400) Last BM Date: 05/09/13 General:   Pleasant white female in NAD Head:  Normocephalic and atraumatic. Eyes:   No icterus.   Conjunctiva pink. Ears:  Normal auditory acuity. Neck:  Supple; no masses felt Lungs:  Respirations even and unlabored. Lungs clear to auscultation bilaterally.   No wheezes, crackles, or rhonchi.  Heart:  Regular rate and rhythm Abdomen:  Soft, nondistended, mild diffuse tenderness.  Normal bowel sounds. No appreciable masses or hepatomegaly.  Rectal:  No blood in vault. Scant fecal stained liquid.  Msk:  Symmetrical without gross deformities.  Extremities:  Without edema. Neurologic:  Alert and  oriented x4;  grossly normal neurologically. Skin:  Intact without significant lesions or rashes. Cervical Nodes:  No significant cervical adenopathy. Psych:  Alert and cooperative. Normal affect.  LAB RESULTS:  Recent Labs  05/09/13 1420 05/10/13 0322  WBC 15.5* 9.8  HGB 16.6* 13.1  HCT 48.2* 38.7  PLT 313 242   BMET  Recent Labs  05/09/13 1420 05/10/13 0322  NA 135* 136*  K 3.8 3.3*  CL 89* 97  CO2 32 28  GLUCOSE 118* 92  BUN 14 9  CREATININE 0.80 0.82  CALCIUM 10.5 8.8   LFT  Recent Labs  05/09/13 1420  PROT 8.1  ALBUMIN 4.0  AST 22  ALT 18  ALKPHOS 97  BILITOT 0.5   PT/INR  Recent Labs  05/09/13 1730  LABPROT 12.8  INR 0.98    STUDIES: Ct Abdomen Pelvis W Contrast  05/09/2013   CLINICAL DATA:  Chest pain, lower abdominal pain.  EXAM: CT ABDOMEN AND PELVIS WITH CONTRAST  TECHNIQUE: Multidetector  CT imaging of the abdomen and pelvis was performed using the standard protocol following bolus administration of intravenous contrast.  CONTRAST:  50mL OMNIPAQUE IOHEXOL 300 MG/ML SOLN, 100mL OMNIPAQUE IOHEXOL 300 MG/ML SOLN  COMPARISON:  CT ABD-PELV W/ CM dated 05/02/2013  FINDINGS: Lung bases are clear.  No pericardial fluid.  No focal hepatic lesion. Post cholecystectomy. Common bile duct is  dilated to the level ampulla measuring 13 mm. This is not changed in short interval follow-up. Pancreas is normal without evidence of pancreatic duct dilatation. No intrahepatic duct dilatation of significance.  The spleen, adrenal glands, and kidneys are normal.  The stomach, small bowel, appendix, and cecum are normal. There is mild bowel wall thickening involving the descending colon over a long segment extending from the splenic flexure to the sigmoid colon (axial image 56 and coronal image 50 by for example. There is minimal diverticular disease through this region. Rectum is normal.  Abdominal aorta is aneurysmal measuring 4.8 x 4.5 cm in axial dimension unchanged from prior. Aneurysm dilatation of the suprarenal aorta measures 5.5 cm. The SMA is not diminutive seen on sagittal image 60. The IMA is not well appreciated.  No retroperitoneal periportal lymphadenopathy. No free fluid the pelvis. Hysterectomy. The bladder is normal. The uterus is normal. No aggressive osseous lesion. Process in the sacrum noted.  IMPRESSION: 1. Long segment of bowel wall inflammation involving the descending colon is new from scan 1 week prior. This finding is consistent with segmental colitis. Concern for ischemic colitis in this watershed location. The SMA is diminutive extending from the aneurysmal aorta. Secondary differentials would include infectious or inflammatory colitis including inflammatory bowel disease. 2. The SMA is diminutive and the IMA is difficult to define. The celiac trunk is the most robust branch vessel. 3. Stable  severe aneurysmal dilatation of the infrarenal and suprarenal abdominal aorta.   Electronically Signed   By: Genevive Bi M.D.   On: 05/09/2013 16:12   PREVIOUS ENDOSCOPIES:            none   Impression / Plan:   13. 73 year old female with three week history of lower abdominal discomfort, nausea and loose stool. She had an isolated episode of rectal bleeding yesterday. Symptoms in setting of CTscan revealing long segment of descending colon inflammation. IMA unable to be defined on scan. C-diff negative, GI pathogen panel pending. CT scan findings may represent ischemic colitis. It is concerning that pain and bowel changes have been present for 3 weeks. Awaiting remaining stool studies. At some point she will likely need colonoscopy for further evaluation. Surgery has evaluated.  2. Tobacco abuse  3. CBD dilation. Common bile duct is dilated to the level ampulla measuring 13 mm. Post-cholecystectomy. Normal LFTs. Normal appearing pancreas / PD duct. Finding may be non-specific  4. CAD / HTN / PVD. She has a large AAA aneurysm on CTscan. Followed by Vascular group in [place].   Thanks   LOS: 1 day   Evelyn Miller  05/10/2013, 10:48 AM    [facility] Attending  I have also seen and assessed the patient and agree with the above note. She has a classic picture for ischemic colitis - usu from small vessel intramucosal ischemia but in her case ? Related to a (possibly) bad IMA  Will do a flex sig tomorrow.  The risks and benefits as well as alternatives of endoscopic procedure(s) have been discussed and reviewed. All questions answered. The patient agrees to proceed.  I stopped Zosyn - this is not an infection in my opinion.  She looks ready for floor  Evelyn Boop, MD, [facility] [phone] (pager) 05/10/2013 6:26 PM

## 2013-05-12 ENCOUNTER — Encounter (HOSPITAL_COMMUNITY): Payer: Self-pay | Admitting: Internal Medicine

## 2013-05-12 LAB — MAGNESIUM: MAGNESIUM: 1.5 mg/dL (ref 1.5–2.5)

## 2013-05-12 LAB — BASIC METABOLIC PANEL
BUN: 6 mg/dL (ref 6–23)
CO2: 24 mEq/L (ref 19–32)
CREATININE: 0.66 mg/dL (ref 0.50–1.10)
Calcium: 8.4 mg/dL (ref 8.4–10.5)
Chloride: 102 mEq/L (ref 96–112)
GFR calc non Af Amer: 84 mL/min — ABNORMAL LOW (ref >90)
Glucose, Bld: 78 mg/dL (ref 70–99)
Potassium: 3.9 mEq/L (ref 3.7–5.3)
Sodium: 138 mEq/L (ref 137–147)

## 2013-05-12 MED ORDER — DICYCLOMINE HCL 10 MG PO CAPS: 10.0000 mg | ORAL_CAPSULE | Freq: Four times a day (QID) | ORAL | Status: AC | PRN

## 2013-05-12 NOTE — Progress Notes (Signed)
Quick Note:  Ischemic colitis - reviewed at hospital ______

## 2013-05-12 NOTE — Progress Notes (Signed)
Nutrition Education Note  -Received verbal consult from RN regarding pt's concern/questions about low fiber/residue diet -Provided pt with "Low Fiber Nutrition Therapy" handout. Encouraged pt to consume refined grain products, well cooked/canned fruits and vegetables, and minimal intake of high fat/sugar foods.  -Pt reported consuming many high fiber/gas producing foods pta (popcorn, beans, cabbage, oatmeal); however pt verbalized understanding importance of complying with low fiber diet and adding back fiber foods as tolerated -Recommend pt add fiber foods one per day to improve tolerance -Provided low fiber suggestions for pt's favorite foods (grits vs oatmeal) -Pt appreciative of handout. Expect good compliance as pt was able to recall diet education information and appeared alert/eager during education  Please re-consult as needed.  Evelyn HugerSarah F Leilana Mcquire MS RD LDN Clinical Dietitian Pager:[phone]

## 2013-05-12 NOTE — Progress Notes (Signed)
    Progress Note   Subjective  feels well today, wants to go home   Objective   Vital signs in last 24 hours: Temp:  [97.6 F (36.4 C)-98.6 F (37 C)] 97.9 F (36.6 C) (05/01 0502) Pulse Rate:  [72-88] 75 (05/01 0502) Resp:  [12-22] 16 (05/01 0502) BP: (142-195)/(61-100) 142/82 mmHg (05/01 0502) SpO2:  [90 %-100 %] 97 % (05/01 0502) Last BM Date: 05/11/13 General:    white female in NAD Abdomen:  Soft, nontender and nondistended. Normal bowel sounds. Extremities:  Without edema. Neurologic:  Alert and oriented,  grossly normal neurologically. Psych:  Cooperative. Normal mood and affect. Lab Results:  BMET  Recent Labs  05/10/13 0322 05/11/13 0310 05/12/13 0317  NA 136* 137 138  K 3.3* 3.1* 3.9  CL 97 98 102  CO2 28 26 24   GLUCOSE 92 78 78  BUN 9 9 6   CREATININE 0.82 0.85 0.66  CALCIUM 8.8 8.8 8.4      Assessment / Plan:    Segmental colitis, most likely ischemic but biopsies pending. Zosyn discontinued yesterday. She will need to follow [facility] with [facility] [place] [facility] upon discharge    LOS: 3 days   Meredith Pelaula M Guenther  05/12/2013, 9:35 AM   [facility] Attending  I have also seen and assessed the patient and agree with the above note. Doing well Biopsies agree w/ ischemic colitis dx. Home today - f/u vascular surgeon soon.  Iva Booparl E. Mazy Culton, MD, [facility] [phone] (pager) 05/12/2013 3:15 PM

## 2013-05-16 ENCOUNTER — Telehealth: Payer: Self-pay | Admitting: Internal Medicine

## 2013-05-16 NOTE — Telephone Encounter (Signed)
Patient advised that she should check with her Vascular surgeon to see who they recommend she see.

## 2013-05-18 NOTE — Discharge Summary (Signed)
Physician Discharge Summary  Evelyn Miller ZOX:[id number] DOB: [birth year] DOA: 05/09/2013  PCP: Evelyn Miller., MD  Admit date: 05/09/2013 Discharge date: 05/12/2013  Time spent: 30 minutes  Recommendations for Outpatient Follow-up:  1. Follow up with PCP in one week 2. Follow up with her vascular surgeon at [place] in one week as recommended.  3. Follow up with gastroenterology as recommended and as needed.   Discharge Diagnoses:  Principal Problem:   Ischemic colitis Active Problems:   GI bleeding   Nausea with vomiting   Leukocytosis, unspecified   Dehydration   Hyponatremia   Tobacco abuse   Obstructive chronic bronchitis without exacerbation   HLD (hyperlipidemia)   GERD (gastroesophageal reflux disease)   HTN (hypertension)   AAA (abdominal aortic aneurysm) without rupture   Discharge Condition: improved.  Diet recommendation: low sodium diet   Filed Weights   05/09/13 2052 05/10/13 0400 05/11/13 0400  Weight: 57.1 kg (125 lb 14.1 oz) 59.1 kg (130 lb 4.7 oz) 57.6 kg (126 lb 15.8 oz)    History of present illness:  Evelyn Miller is a 73 y.o. female with pmh significant for tobacco abuse, HLD, known AAA (w/o mentioned of rupture or repair), CAD/MI in the past, GERD and HTN; who presented to [facility] ED 2 weeks hx of on/off abd pain. Patient reports pain is worse with food intake and associated with nausea, vomiting, diarrhea initially and then maroon color stools. She reports no traveling, camping, other sick contacts. Patient endorses general malaise and weakness, along with anorexia. She denies headache, chest pain, dyspnea, cough, fever, chills, hematemesis or urinary tract infection symptoms. [facility] called to admit patient for further evaluation and treatment.  Most likely ischemic colitis; other considerations includes IBD vs infectious colitis. IVF's along with empiric abx's started in ED. Gastroenterology consulted. She underwent flexible sigmoidoscopy and was found to  have signs of ischemic colitis. Her beleding and pain improved and she was discharged to follow up with vascular surgery at [place] as recommended.   Hospital Course:  -Colitis:  -she was initially admitted to step down and started on IV antibiotics for possible infectious colitis.  c diff pcr and GI pathogen negative. antibitoics were discontinued.  Underwent sigmoidoscopy and was found to have edema, ulcers and erythema in the descending colon consistent with ischemic colitis. Biopsies taken and advanced diet as tolerated. Her symptoms resolved on discharged. She was recommended to follow up with VASCULAR as outpatient.  2-GI bleeding: most likely secondary to ischemic colitis; other considerations includes diverticulosis. No colonoscopy on records.  Gi consulted and she underwent sigmoidoscopy was found to have ulcers and edema consistent with ischemic colitis. Biopsies were taken and she was recommended to follow up with vascular surgery as outpatient.  3-Nausea with vomiting: secondary to #1.  Resolved on discharge.  4-Leukocytosis, unspecified: stress demargination vs infectious colitis.  Resolved.  5-Dehydration and mild hyponatremia: resolved.  6-Tobacco abuse: counseling cessation provided.  -nicotine patch ordered  7-Obstructive chronic bronchitis without exacerbation: due to tobacco abuse most likely.  -no fever, wheezing or productive cough currently  -will continue PRN albuterol on discharge.  8-HLD (hyperlipidemia): with statins intolerance.  9-GERD (gastroesophageal reflux disease): continue with protonix  10-HTN (hypertension):  Slightly high, resume home medications.  11-AAA (abdominal aortic aneurysm) without rupture: stable by CT scan. Patient will continue follow up with vascular surgery as an outpatient. (she follow in [facility])  HYpokalemia; replete as needed.     Procedures:  sigmoidoscopy  Consultations:  gi  Discharge Exam: Filed Vitals:   05/12/13  1339  BP: 148/77  Pulse: 81  Temp: 97.7 F (36.5 C)  Resp: 16    General: alert afebrile comfortable Cardiovascular:s1s2 Respiratory: ctab  Discharge Instructions You were cared for by a hospitalist during your hospital stay. If you have any questions about your discharge medications or the care you received while you were in the hospital after you are discharged, you can call the unit and asked to speak with the hospitalist on call if the hospitalist that took care of you is not available. Once you are discharged, your primary care physician will handle any further medical issues. Please note that NO REFILLS for any discharge medications will be authorized once you are discharged, as it is imperative that you return to your primary care physician (or establish a relationship with a primary care physician if you do not have one) for your aftercare needs so that they can reassess your need for medications and monitor your lab values.  Discharge Orders   Future Orders Complete By Expires   Diet - low sodium heart healthy  As directed    Discharge instructions  As directed        Medication List         aspirin 81 MG tablet  Take 81 mg by mouth daily.     dicyclomine 10 MG capsule  Commonly known as:  BENTYL  Take 1 capsule (10 mg total) by mouth every 6 (six) hours as needed for spasms (cramping).     losartan-hydrochlorothiazide 100-25 MG per tablet  Commonly known as:  HYZAAR  Take 1 tablet by mouth daily.     omeprazole 40 MG capsule  Commonly known as:  PRILOSEC  Take 40 mg by mouth daily.     ondansetron 4 MG tablet  Commonly known as:  ZOFRAN  Take 4 mg by mouth every 12 (twelve) hours as needed for nausea or vomiting.     PRESCRIPTION MEDICATION  Place 1 application in ear(s) 3 (three) times a week. Ear itching     propranolol 80 MG tablet  Commonly known as:  INDERAL  Take 80 mg by mouth daily.       Allergies  Allergen Reactions  . Other     [place] nuts  make pt's throat itch.  . Ciprofloxacin Itching  . Codeine     weakness      The results of significant diagnostics from this hospitalization (including imaging, microbiology, ancillary and laboratory) are listed below for reference.    Significant Diagnostic Studies: Ct Abdomen Pelvis W Contrast  05/09/2013   CLINICAL DATA:  Chest pain, lower abdominal pain.  EXAM: CT ABDOMEN AND PELVIS WITH CONTRAST  TECHNIQUE: Multidetector CT imaging of the abdomen and pelvis was performed using the standard protocol following bolus administration of intravenous contrast.  CONTRAST:  50mL OMNIPAQUE IOHEXOL 300 MG/ML SOLN, 100mL OMNIPAQUE IOHEXOL 300 MG/ML SOLN  COMPARISON:  CT ABD-PELV W/ CM dated 05/02/2013  FINDINGS: Lung bases are clear.  No pericardial fluid.  No focal hepatic lesion. Post cholecystectomy. Common bile duct is dilated to the level ampulla measuring 13 mm. This is not changed in short interval follow-up. Pancreas is normal without evidence of pancreatic duct dilatation. No intrahepatic duct dilatation of significance.  The spleen, adrenal glands, and kidneys are normal.  The stomach, small bowel, appendix, and cecum are normal. There is mild bowel wall thickening involving the descending colon over a long segment extending from the splenic  flexure to the sigmoid colon (axial image 56 and coronal image 50 by for example. There is minimal diverticular disease through this region. Rectum is normal.  Abdominal aorta is aneurysmal measuring 4.8 x 4.5 cm in axial dimension unchanged from prior. Aneurysm dilatation of the suprarenal aorta measures 5.5 cm. The SMA is not diminutive seen on sagittal image 60. The IMA is not well appreciated.  No retroperitoneal periportal lymphadenopathy. No free fluid the pelvis. Hysterectomy. The bladder is normal. The uterus is normal. No aggressive osseous lesion. Process in the sacrum noted.  IMPRESSION: 1. Long segment of bowel wall inflammation involving the  descending colon is new from scan 1 week prior. This finding is consistent with segmental colitis. Concern for ischemic colitis in this watershed location. The SMA is diminutive extending from the aneurysmal aorta. Secondary differentials would include infectious or inflammatory colitis including inflammatory bowel disease. 2. The SMA is diminutive and the IMA is difficult to define. The celiac trunk is the most robust branch vessel. 3. Stable severe aneurysmal dilatation of the infrarenal and suprarenal abdominal aorta.   Electronically Signed   By: Genevive BiStewart  Edmunds M.D.   On: 05/09/2013 16:12   Dg Chest Portable 1 View  05/09/2013   CLINICAL DATA:  Pain  EXAM: PORTABLE CHEST - 1 VIEW  COMPARISON:  December 02, 2007  FINDINGS: There is underlying emphysematous change. There is no edema or consolidation. Heart is mildly enlarged with pulmonary vascularity within normal limits. Aorta is tortuous and rather prominent but stable. No adenopathy. No bone lesions.  IMPRESSION: Underlying emphysematous change. No edema or consolidation. Heart prominent but stable. Aorta tortuous but stable.   Electronically Signed   By: Bretta BangWilliam  Woodruff M.D.   On: 05/09/2013 16:20    Microbiology: Recent Results (from the past 240 hour(s))  URINE CULTURE     Status: None   Collection Time    05/09/13  3:52 PM      Result Value Ref Range Status   Specimen Description URINE, CLEAN CATCH   Final   Special Requests Normal   Final   Culture  Setup Time     Final   Value: 05/10/2013 04:43     Performed at [facility]   Colony Count     Final   Value: 40,000 COLONIES/ML     Performed at [facility]   Culture     Final   Value: Multiple bacterial morphotypes present, none predominant. Suggest appropriate recollection if clinically indicated.     Performed at [facility]   Report Status 05/11/2013 FINAL   Final  MRSA PCR SCREENING     Status: None   Collection Time    05/09/13  8:54 PM       Result Value Ref Range Status   MRSA by PCR NEGATIVE  NEGATIVE Final   Comment:            The GeneXpert MRSA Assay (FDA     approved for NASAL specimens     only), is one component of a     comprehensive MRSA colonization     surveillance program. It is not     intended to diagnose MRSA     infection nor to guide or     monitor treatment for     MRSA infections.  CLOSTRIDIUM DIFFICILE BY PCR     Status: None   Collection Time    05/09/13  9:39 PM      Result Value Ref  Range Status   C difficile by pcr NEGATIVE  NEGATIVE Final   Comment: Performed at [facility]     Labs: Basic Metabolic Panel:  Recent Labs Lab 05/11/13 0310 05/12/13 0317  NA 137 138  K 3.1* 3.9  CL 98 102  CO2 26 24  GLUCOSE 78 78  BUN 9 6  CREATININE 0.85 0.66  CALCIUM 8.8 8.4  MG  --  1.5   Liver Function Tests: No results found for this basename: AST, ALT, ALKPHOS, BILITOT, PROT, ALBUMIN,  in the last 168 hours No results found for this basename: LIPASE, AMYLASE,  in the last 168 hours No results found for this basename: AMMONIA,  in the last 168 hours CBC: No results found for this basename: WBC, NEUTROABS, HGB, HCT, MCV, PLT,  in the last 168 hours Cardiac Enzymes: No results found for this basename: CKTOTAL, CKMB, CKMBINDEX, TROPONINI,  in the last 168 hours BNP: BNP (last 3 results) No results found for this basename: PROBNP,  in the last 8760 hours CBG: No results found for this basename: GLUCAP,  in the last 168 hours     Signed:  Kathlen Mody  [facility] 05/14/2013, 2:40 AM

## 2013-07-07 ENCOUNTER — Encounter: Payer: Self-pay | Admitting: Surgery

## 2013-07-10 ENCOUNTER — Encounter: Payer: Self-pay | Admitting: Surgery

## 2013-07-10 ENCOUNTER — Ambulatory Visit (INDEPENDENT_AMBULATORY_CARE_PROVIDER_SITE_OTHER): Payer: Medicare PPO | Admitting: Surgery

## 2013-07-10 VITALS — BP 135/77 | HR 68 | Ht 60.0 in | Wt 117.0 lb

## 2013-07-10 DIAGNOSIS — I739 Peripheral vascular disease, unspecified: Secondary | ICD-10-CM

## 2013-07-10 DIAGNOSIS — I714 Abdominal aortic aneurysm, without rupture, unspecified: Secondary | ICD-10-CM

## 2013-07-10 NOTE — Progress Notes (Signed)
Patient name: Evelyn Miller MRN: [id number] DOB: 1940-02-15 Sex: female   Referred by: Dr. Jacqlyn LarsenBulla  Reason for referral:  Chief Complaint  Patient presents with  . New Evaluation    est care for PAD and AAA    HISTORY OF PRESENT ILLNESS: This is a very pleasant 73 year old female that is referred today for possible chronic mesenteric ischemia.  The patient has been followed and [place] for her vascular disease.  She has a known abdominal aortic aneurysm.  She has previously undergone right common iliac artery stenting using an 8 x 18 balloon expandable stent.  She has had a carotid Doppler in April 2015 which shows a 60-79% left carotid stenosis and 1-39% right carotid stenosis.  Her ABI on the right was 0.88 and on the left was 0.84.  Approximately 2-3 months ago, the patient was hospitalized for abdominal pain.  Her workup included a CT scan which revealed possible colonic ischemia with a diminutive superior mesenteric artery.  A thoracoabdominal aneurysm was identified.  The thoracic component measured 5.5 cm.  The infrarenal component was 4.7 cm.  The patient complaints of early satiety.  She has postprandial abdominal pain.  She has lost 13 pounds over the course of the past 2 months.  The patient has a significant vascular family history.  Her mother and brother died from ruptured abdominal aortic aneurysm.  She has 2 children who have had cardiac issues in their 3140s.  The patient also had a heart attack in her 3740s.  The patient is medically managed for hypertension with an ARB.  She has hypercholesterolemia but has not tolerated statins.  She is a smoker.    Past Medical History  Diagnosis Date  . Hypertension   . MI (myocardial infarction)   . AAA (abdominal aortic aneurysm)   . Macular degeneration     in left eye  . Ischemic colitis 05/09/2013  . COPD (chronic obstructive pulmonary disease)   . CAD (coronary artery disease)     Past Surgical History  Procedure  Laterality Date  . Cholecystectomy    . Hernia repair    . Stent in left leg    . Flexible sigmoidoscopy N/A 05/11/2013    Procedure: FLEXIBLE SIGMOIDOSCOPY;  Surgeon: Iva Booparl E Gessner, MD;  Location: [facility];  Service: Endoscopy;  Laterality: N/A;    History   Social History  . Marital Status: Widowed    Spouse Name: N/A    Number of Children: N/A  . Years of Education: N/A   Occupational History  . Not on file.   Social History Main Topics  . Smoking status: Current Every Day Smoker -- 1.00 packs/day for 60 years    Types: Cigarettes  . Smokeless tobacco: Never Used  . Alcohol Use: Yes     Comment: occasional  . Drug Use: No  . Sexual Activity: Not on file   Other Topics Concern  . Not on file   Social History Narrative  . No narrative on file    Family History  Problem Relation Age of Onset  . Heart disease Mother   . Hypertension Mother   . Bleeding Disorder Mother   . AAA (abdominal aortic aneurysm) Mother   . Cancer Father   . Cancer Sister   . Cancer Brother   . Heart disease Brother   . AAA (abdominal aortic aneurysm) Brother   . Hypertension Daughter   . Heart disease Son     before age 75  .  Hypertension Son     Allergies as of 07/10/2013 - Review Complete 07/10/2013  Allergen Reaction Noted  . Other  05/09/2013  . Atorvastatin  07/10/2013  . Ciprofloxacin Itching 05/11/2013  . Codeine  05/09/2013  . Statins  07/10/2013    Current Outpatient Prescriptions on File Prior to Visit  Medication Sig Dispense Refill  . aspirin 81 MG tablet Take 81 mg by mouth daily.      Marland Kitchen. losartan-hydrochlorothiazide (HYZAAR) 100-25 MG per tablet Take 1 tablet by mouth daily.      Marland Kitchen. omeprazole (PRILOSEC) 40 MG capsule Take 60 mg by mouth daily.       . propranolol (INDERAL) 80 MG tablet Take 80 mg by mouth daily.       Marland Kitchen. dicyclomine (BENTYL) 10 MG capsule Take 1 capsule (10 mg total) by mouth every 6 (six) hours as needed for spasms (cramping).  10 capsule  0    . ondansetron (ZOFRAN) 4 MG tablet Take 4 mg by mouth every 12 (twelve) hours as needed for nausea or vomiting.      Marland Kitchen. PRESCRIPTION MEDICATION Place 1 application in ear(s) 3 (three) times a week. Ear itching       No current facility-administered medications on file prior to visit.     REVIEW OF SYSTEMS: Cardiovascular: No chest pain, chest pressure, palpitations, orthopnea, or dyspnea on exertion. No claudication or rest pain,  No history of DVT or phlebitis. please see history of present illness Pulmonary: No productive cough, asthma or wheezing. Neurologic: No weakness, paresthesias, aphasia, or amaurosis. No dizziness. Hematologic: No bleeding problems or clotting disorders. Musculoskeletal: No joint pain or joint swelling. Gastrointestinal: No blood in stool or hematemesis.  Please see history of present illness  Genitourinary: No dysuria or hematuria. Psychiatric:: No history of major depression. Integumentary: No rashes or ulcers. Constitutional: No fever or chills.  PHYSICAL EXAMINATION: General: The patient appears their stated age.  Vital signs are BP 135/77  Pulse 68  Ht 5' (1.524 m)  Wt 117 lb (53.071 kg)  BMI 22.85 kg/m2  SpO2 100% HEENT:  No gross abnormalities Pulmonary: Respirations are non-labored Abdomen: Soft and non-tender.  Right upper quadrant incision without hernia.  Aneurysm is easily palpable and nontender.    Musculoskeletal: There are no major deformities.   Neurologic: No focal weakness or paresthesias are detected, Skin: There are no ulcer or rashes noted. Psychiatric: The patient has normal affect. Cardiovascular: There is a regular rate and rhythm without significant murmur appreciated. no carotid bruits.  Palpable femoral pulses.  Diagnostic Studies:  CT scan: 5.5 cm thoracic aneurysm.  4.7 cm infrarenal aortic aneurysm.  Diminutive superior mesenteric artery.  Inferior mesenteric artery not well visualized.,  Ischemic colitis. ABI: 0.88 on the  right, 0.8 for the left Carotid Dopplers (04/2013): Left 60-79% stenosis.  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  I told her that I feel she needs to have both her mesenteric vasculature and her aneurysm treated simultaneously.  Because she would need a four-vessel fenestration/branch device, I am going to [facility] for further discussions and evaluations see if she is a candidate for this device.  Otherwise she may require an open thoracoabdominal repair.  This could be done either a [facility] or [place], at the patient's request.     V.  Leia Alf, M.D. [facility] Office: [phone] Pager:  [phone]

## 2013-09-04 ENCOUNTER — Encounter: Payer: Self-pay | Admitting: Surgery

## 2018-01-07 ENCOUNTER — Emergency Department (HOSPITAL_COMMUNITY): Payer: Medicare Other

## 2018-01-07 ENCOUNTER — Inpatient Hospital Stay (HOSPITAL_COMMUNITY)
Admission: EM | Admit: 2018-01-07 | Discharge: 2018-01-12 | DRG: 065 | Disposition: E | Payer: Medicare Other | Attending: Family Medicine | Admitting: Family Medicine

## 2018-01-07 ENCOUNTER — Encounter (HOSPITAL_COMMUNITY): Payer: Self-pay

## 2018-01-07 DIAGNOSIS — Z91018 Allergy to other foods: Secondary | ICD-10-CM

## 2018-01-07 DIAGNOSIS — I614 Nontraumatic intracerebral hemorrhage in cerebellum: Secondary | ICD-10-CM | POA: Diagnosis present

## 2018-01-07 DIAGNOSIS — I252 Old myocardial infarction: Secondary | ICD-10-CM | POA: Diagnosis not present

## 2018-01-07 DIAGNOSIS — E785 Hyperlipidemia, unspecified: Secondary | ICD-10-CM | POA: Diagnosis present

## 2018-01-07 DIAGNOSIS — Z9049 Acquired absence of other specified parts of digestive tract: Secondary | ICD-10-CM | POA: Diagnosis not present

## 2018-01-07 DIAGNOSIS — Z885 Allergy status to narcotic agent status: Secondary | ICD-10-CM

## 2018-01-07 DIAGNOSIS — K219 Gastro-esophageal reflux disease without esophagitis: Secondary | ICD-10-CM | POA: Diagnosis present

## 2018-01-07 DIAGNOSIS — E039 Hypothyroidism, unspecified: Secondary | ICD-10-CM | POA: Diagnosis present

## 2018-01-07 DIAGNOSIS — G919 Hydrocephalus, unspecified: Secondary | ICD-10-CM | POA: Diagnosis present

## 2018-01-07 DIAGNOSIS — I10 Essential (primary) hypertension: Secondary | ICD-10-CM | POA: Diagnosis present

## 2018-01-07 DIAGNOSIS — Z515 Encounter for palliative care: Secondary | ICD-10-CM | POA: Diagnosis present

## 2018-01-07 DIAGNOSIS — I739 Peripheral vascular disease, unspecified: Secondary | ICD-10-CM | POA: Diagnosis present

## 2018-01-07 DIAGNOSIS — I619 Nontraumatic intracerebral hemorrhage, unspecified: Secondary | ICD-10-CM | POA: Diagnosis not present

## 2018-01-07 DIAGNOSIS — Z7982 Long term (current) use of aspirin: Secondary | ICD-10-CM

## 2018-01-07 DIAGNOSIS — I629 Nontraumatic intracranial hemorrhage, unspecified: Secondary | ICD-10-CM | POA: Diagnosis not present

## 2018-01-07 DIAGNOSIS — I2510 Atherosclerotic heart disease of native coronary artery without angina pectoris: Secondary | ICD-10-CM | POA: Diagnosis present

## 2018-01-07 DIAGNOSIS — Z888 Allergy status to other drugs, medicaments and biological substances status: Secondary | ICD-10-CM | POA: Diagnosis not present

## 2018-01-07 DIAGNOSIS — G9389 Other specified disorders of brain: Secondary | ICD-10-CM | POA: Diagnosis present

## 2018-01-07 DIAGNOSIS — F17210 Nicotine dependence, cigarettes, uncomplicated: Secondary | ICD-10-CM | POA: Diagnosis present

## 2018-01-07 DIAGNOSIS — Z66 Do not resuscitate: Secondary | ICD-10-CM | POA: Diagnosis present

## 2018-01-07 DIAGNOSIS — I613 Nontraumatic intracerebral hemorrhage in brain stem: Secondary | ICD-10-CM | POA: Diagnosis not present

## 2018-01-07 DIAGNOSIS — Z809 Family history of malignant neoplasm, unspecified: Secondary | ICD-10-CM

## 2018-01-07 DIAGNOSIS — Z79890 Hormone replacement therapy (postmenopausal): Secondary | ICD-10-CM | POA: Diagnosis not present

## 2018-01-07 DIAGNOSIS — K551 Chronic vascular disorders of intestine: Secondary | ICD-10-CM | POA: Diagnosis present

## 2018-01-07 DIAGNOSIS — Z8249 Family history of ischemic heart disease and other diseases of the circulatory system: Secondary | ICD-10-CM | POA: Diagnosis not present

## 2018-01-07 DIAGNOSIS — Z79899 Other long term (current) drug therapy: Secondary | ICD-10-CM

## 2018-01-07 DIAGNOSIS — Z832 Family history of diseases of the blood and blood-forming organs and certain disorders involving the immune mechanism: Secondary | ICD-10-CM

## 2018-01-07 DIAGNOSIS — J449 Chronic obstructive pulmonary disease, unspecified: Secondary | ICD-10-CM | POA: Diagnosis present

## 2018-01-07 DIAGNOSIS — I714 Abdominal aortic aneurysm, without rupture: Secondary | ICD-10-CM | POA: Diagnosis present

## 2018-01-07 LAB — COMPREHENSIVE METABOLIC PANEL
ALT: 18 U/L (ref 0–44)
ANION GAP: 17 — AB (ref 5–15)
AST: 47 U/L — ABNORMAL HIGH (ref 15–41)
Albumin: 4.1 g/dL (ref 3.5–5.0)
Alkaline Phosphatase: 54 U/L (ref 38–126)
BUN: 16 mg/dL (ref 8–23)
CHLORIDE: 93 mmol/L — AB (ref 98–111)
CO2: 22 mmol/L (ref 22–32)
Calcium: 9.4 mg/dL (ref 8.9–10.3)
Creatinine, Ser: 1.03 mg/dL — ABNORMAL HIGH (ref 0.44–1.00)
GFR calc Af Amer: 60 mL/min — ABNORMAL LOW (ref >60)
GFR, EST NON AFRICAN AMERICAN: 52 mL/min — AB (ref >60)
Glucose, Bld: 111 mg/dL — ABNORMAL HIGH (ref 70–99)
Potassium: 4.1 mmol/L (ref 3.5–5.1)
SODIUM: 132 mmol/L — AB (ref 135–145)
Total Bilirubin: 2.1 mg/dL — ABNORMAL HIGH (ref 0.3–1.2)
Total Protein: 7 g/dL (ref 6.5–8.1)

## 2018-01-07 LAB — CBC WITH DIFFERENTIAL/PLATELET
Abs Immature Granulocytes: 0.12 10*3/uL — ABNORMAL HIGH (ref 0.00–0.07)
BASOS ABS: 0 10*3/uL (ref 0.0–0.1)
Basophils Relative: 0 %
Eosinophils Absolute: 0.1 10*3/uL (ref 0.0–0.5)
Eosinophils Relative: 1 %
HCT: 38.4 % (ref 36.0–46.0)
Hemoglobin: 12.7 g/dL (ref 12.0–15.0)
Immature Granulocytes: 1 %
LYMPHS ABS: 1.1 10*3/uL (ref 0.7–4.0)
Lymphocytes Relative: 8 %
MCH: 31.4 pg (ref 26.0–34.0)
MCHC: 33.1 g/dL (ref 30.0–36.0)
MCV: 95 fL (ref 80.0–100.0)
Monocytes Absolute: 1.1 10*3/uL — ABNORMAL HIGH (ref 0.1–1.0)
Monocytes Relative: 8 %
NEUTROS ABS: 10.7 10*3/uL — AB (ref 1.7–7.7)
Neutrophils Relative %: 82 %
PLATELETS: 173 10*3/uL (ref 150–400)
RBC: 4.04 MIL/uL (ref 3.87–5.11)
RDW: 12 % (ref 11.5–15.5)
WBC: 13.1 10*3/uL — AB (ref 4.0–10.5)
nRBC: 0 % (ref 0.0–0.2)

## 2018-01-07 LAB — I-STAT CG4 LACTIC ACID, ED: LACTIC ACID, VENOUS: 1.66 mmol/L (ref 0.5–1.9)

## 2018-01-07 LAB — MAGNESIUM: MAGNESIUM: 1.7 mg/dL (ref 1.7–2.4)

## 2018-01-07 LAB — TROPONIN I: Troponin I: 0.07 ng/mL (ref <0.03)

## 2018-01-07 MED ORDER — LORAZEPAM 1 MG PO TABS: 1.0000 mg | ORAL_TABLET | ORAL | Status: DC | PRN

## 2018-01-07 MED ORDER — LORAZEPAM 2 MG/ML IJ SOLN
1.0000 mg | INTRAMUSCULAR | Status: DC | PRN
  Administered 2018-01-07: 1 mg via INTRAVENOUS
  Filled 2018-01-07: qty 1

## 2018-01-07 MED ORDER — LORAZEPAM 2 MG/ML PO CONC: 1.0000 mg | ORAL | Status: DC | PRN

## 2018-01-07 MED ORDER — ONDANSETRON 4 MG PO TBDP: 4.0000 mg | ORAL_TABLET | Freq: Four times a day (QID) | ORAL | Status: DC | PRN

## 2018-01-07 MED ORDER — LEVETIRACETAM IN NACL 1000 MG/100ML IV SOLN
1000.0000 mg | Freq: Once | INTRAVENOUS | Status: AC
  Administered 2018-01-07: 1000 mg via INTRAVENOUS
  Filled 2018-01-07: qty 100

## 2018-01-07 MED ORDER — MORPHINE SULFATE (PF) 2 MG/ML IV SOLN
1.0000 mg | INTRAVENOUS | Status: DC | PRN
  Administered 2018-01-07 (×2): 1 mg via INTRAVENOUS
  Filled 2018-01-07 (×3): qty 1

## 2018-01-07 MED ORDER — LORAZEPAM 2 MG/ML IJ SOLN
1.0000 mg | INTRAMUSCULAR | Status: DC | PRN
  Filled 2018-01-07: qty 1

## 2018-01-07 MED ORDER — ONDANSETRON HCL 4 MG/2ML IJ SOLN: 4.0000 mg | Freq: Four times a day (QID) | INTRAMUSCULAR | Status: DC | PRN

## 2018-01-07 MED ORDER — MORPHINE SULFATE (PF) 2 MG/ML IV SOLN
1.0000 mg | INTRAVENOUS | Status: DC | PRN
  Administered 2018-01-07: 1 mg via INTRAVENOUS
  Filled 2018-01-07: qty 1

## 2018-01-07 MED ORDER — MAGIC MOUTHWASH
15.0000 mL | Freq: Four times a day (QID) | ORAL | Status: DC | PRN
  Administered 2018-01-07: 15 mL via ORAL
  Filled 2018-01-07: qty 15

## 2018-01-07 MED ORDER — LACTATED RINGERS IV BOLUS
1000.0000 mL | Freq: Once | INTRAVENOUS | Status: AC
  Administered 2018-01-07: 1000 mL via INTRAVENOUS

## 2018-01-07 MED ORDER — LORAZEPAM 2 MG/ML IJ SOLN
INTRAMUSCULAR | Status: AC
  Filled 2018-01-07: qty 1

## 2018-01-07 MED ORDER — FENTANYL CITRATE (PF) 100 MCG/2ML IJ SOLN: 25.0000 ug | INTRAMUSCULAR | Status: DC | PRN

## 2018-01-12 LAB — CULTURE, BLOOD (ROUTINE X 2)
CULTURE: NO GROWTH
Culture: NO GROWTH
Special Requests: ADEQUATE
Special Requests: ADEQUATE

## 2018-01-12 NOTE — Progress Notes (Signed)
   08-25-2017 1700  Clinical Encounter Type  Visited With Patient and family together  Visit Type Initial  Referral From Nurse  Consult/Referral To Chaplain  Spiritual Encounters  Spiritual Needs Emotional;Prayer  Stress Factors  Patient Stress Factors Other (Comment)  Responded to spiritual care consult. PT was unresponsive and family was at bedside. Family was grateful for the Chaplain visit. Also, family stated that they had other family planning to visit soon. I offered spiritual care with words of encouragement, ministry of presence, a listening ear and prayer. Chaplain available as needed.   Chaplain Orest DikesAbel Camyla Camposano  [phone]

## 2018-01-12 NOTE — Progress Notes (Signed)
I saw and examined this patient.  I discussed with the full resident team.  I will co-sign the H&PE when available.  We have agreed on a management plan. Sadly, this is a simple story.  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  The family is at the bedside and are in full agreement with her comfort care.  To document that we are aware, the family tells me that this is her third ER visit since Christmas Day.  She first [facility] and then [facility].  I reviewed the care everywhere imaging with the family.  She had a CT of her chest, abd and pelvis but no CNS imaging.  The family is appreciative of the kind and definitive care they have thus far received with us.   Prognosis is hours to days.

## 2018-01-12 NOTE — Consult Note (Signed)
Neurology Consultation Reason for Consult: Intracranial hemorrhage Referring Physician: Desiree LucyMessner, Evelyn Miller  CC: Unresponsiveness  History is obtained from: Family  HPI: Evelyn Miller is a 78 y.o. female began having intractable nausea and vomiting and unsteady gait 2 days ago.  Burgess EstelleYesterday, she [facility] she was evaluated as a nausea and vomiting and then [facility] due to concern for an enlarging aortic aneurysm.  There, her nausea and vomiting was attributed to chronic mesenteric ischemia and she was discharged yesterday afternoon.  Overnight, she has progressively gotten worse, less responsive and presented today in acute distress.  LKW: 12/25 tpa given?: no, ICH  ROS:  Unable to obtain due to altered mental status.   Past Medical History:  Diagnosis Date  . AAA (abdominal aortic aneurysm) (HCC)   . CAD (coronary artery disease)   . COPD (chronic obstructive pulmonary disease) (HCC)   . Hypertension   . Ischemic colitis (HCC) 05/09/2013  . Macular degeneration    in left eye  . MI (myocardial infarction) (HCC)      Family History  Problem Relation Age of Onset  . Heart disease Mother   . Hypertension Mother   . Bleeding Disorder Mother   . AAA (abdominal aortic aneurysm) Mother   . Cancer Father   . Cancer Sister   . Cancer Brother   . Heart disease Brother   . AAA (abdominal aortic aneurysm) Brother   . Hypertension Daughter   . Heart disease Son        before age 80  . Hypertension Son      Social History:  reports that she has been smoking cigarettes. She has a 60.00 pack-year smoking history. She has never used smokeless tobacco. She reports current alcohol use. She reports that she does not use drugs.   Exam: Current vital signs: BP (!) 158/98 (BP Location: Right Arm)   Pulse (!) 106   Temp 98.2 F (36.8 C) (Rectal)   Resp 18   SpO2 100%  Vital signs in last 24 hours: Temp:  [98.2 F (36.8 C)] 98.2 F (36.8 C)  (12/27 0955) Pulse Rate:  [106] 106 (12/27 0955) Resp:  [18] 18 (12/27 0955) BP: (158)/(98) 158/98 (12/27 0955) SpO2:  [100 %] 100 % (12/27 0955)   Physical Exam  Constitutional: Appears well-developed and well-nourished.  Psych: Affect appropriate to situation Eyes: No scleral injection HENT: No OP obstrucion Head: Normocephalic.  Cardiovascular: Normal rate and regular rhythm.  Respiratory: Effort normal, non-labored breathing GI: Soft.  No distension. There is no tenderness.  Skin: WDI  Neuro: Mental Status: Patient is obtunded, she does not follow commands, but she does open eyes.  She does not clearly fixate or track, but she does look to both sides.  She does try to physically move herself with her legs when noxious stimulation is applied Cranial Nerves: II: Does not clearly blink to threat, right pupil is sluggish III,IV, VI: Eyes cross midline in both directions V: VII: Corneals are absent bilaterally Motor: Extensor posturing in bilateral arms, withdrawal in bilateral legs to noxious simulation Sensory: As above  Cerebellar: Does not perform  I have reviewed labs in epic and the results pertinent to this consultation are: CMP-unremarkable  I have reviewed the images obtained: CT head- large brainstem and cerebellar hemorrhage with subarachnoid  Impression: 78 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  Given her previous wishes that she would not want to be intubated, there is not  being pursued at this time.  I discussed with the family that even with aggressive care, she is likely to be very debilitated, though a ventriculostomy/hyperosmolar therapy could potentially be lifesaving.  Recommendations: 1) Could consider discussion with neurosurgery to see if ventriculostomy would be an option 2) If no ventriculostomy, then would recommend full comfort care.    This patient is critically ill and at significant risk of neurological  worsening, death and care requires constant monitoring of vital signs, hemodynamics,respiratory and cardiac monitoring, neurological assessment, discussion with family, other specialists and medical decision making of high complexity. I spent 45 minutes of neurocritical care time  in the care of  this patient.  Ritta SlotMcNeill Jawann Urbani, MD [facility] [phone]  If 7pm- 7am, please page neurology on call as listed in AMION. October 21, 2017  11:22 AM

## 2018-01-12 NOTE — ED Notes (Signed)
Attempted report 

## 2018-01-12 NOTE — Discharge Summary (Signed)
Family Medicine [facility] Discharge Summary  Patient name: Evelyn Madeiraorma Miller Medical record number: [id number] Date of birth: Aug 13, 1940 Age: 78 y.o. Gender: female Date of Admission: Apr 13, 2017  Date of Discharge: Apr 13, 2017 Admitting Physician: No admitting provider for patient encounter.  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  Eyes: pupils fixed, unresponsive to light.  Rhythmic, slowed horizontal nystagmus but patient is not deliberately tracking movement.   ENTM: mouth open. No oropharyngeal erythema.  Cardiovascular: regular rhythm. Normal rate.  No murmurs.  Respiratory: lungs clear to auscultation anteriorly.   Gastrointestinal: normal bowel sounds.  Soft.  Nondistended.  MSK: patient does not move limbs to command.   Derm: several areas of bruising along limbs.  Neuro: CN not intact.  Patient unable to follow commands.  Lower extremities react to touch.  Patellar reflex intact bilaterally.   Psych: unconscious.   Brief Hospital Course:  Patient arrived to the ED in a state of unconsciousness. CT head was performed and showed significantly large brainstem and cerebellar bleed with mild tonsillar herniation. Neurology and neurosurgery were consulted and did not believe ventriculostomy would improve patient's outcome.  Patient was transitioned to comfort care.  Chaplain and palliative care were consulted for family support. Time of death 11823.        Issues for Follow Up:  none  Significant Procedures: none  Significant Labs and  Imaging:  Recent Labs  Lab 05/16/17 1012  WBC 13.1*  HGB 12.7  HCT 38.4  PLT 173   Recent Labs  Lab 05/16/17 1012  NA 132*  K 4.1  CL 93*  CO2 22  GLUCOSE 111*  BUN 16  CREATININE 1.03*  CALCIUM 9.4  MG 1.7  ALKPHOS 54  AST 47*  ALT 18  ALBUMIN 4.1    Results/Tests Pending at Time of Discharge: none  Discharge Medications:  Allergies as of Apr 13, 2017      Reactions   Other    [place] nuts make pt's throat itch.   Atorvastatin Other (See Comments)   Leg Muscle Pain Leg Muscle Pain   Ciprofloxacin Itching   Codeine    weakness   Statins Other (See Comments)   simvastatin      Medication List    ASK your doctor about these medications   albuterol 108 (90 Base) MCG/ACT inhaler Commonly known as:  PROVENTIL HFA;VENTOLIN HFA Inhale 2 puffs into the lungs as needed.   amLODipine 10 MG tablet Commonly known as:  NORVASC Take 10 mg by mouth daily.   aspirin 81 MG tablet Take 81 mg by mouth daily.   dicyclomine 10 MG capsule Commonly known as:  BENTYL Take 1 capsule (10 mg total) by mouth every 6 (six) hours as needed for spasms (cramping).   levothyroxine 25 MCG tablet Commonly known as:  SYNTHROID, LEVOTHROID Take 25 mcg by mouth daily.   losartan 100 MG tablet Commonly known as:  COZAAR Take 100 mg by mouth daily.       Discharge Instructions: Please refer to Patient Instructions section of EMR for full details.  Patient was counseled important signs  and symptoms that should prompt return to medical care, changes in medications, dietary instructions, activity restrictions, and follow up appointments.   Follow-Up Appointments:   Sandre Kittylson,  K, MD 01/08/2018, 4:01 PM PGY-1, [facility]

## 2018-01-12 NOTE — ED Notes (Signed)
Paged Dr Leveda AnnaHensel to RN CovingtonJessica @ [phone]

## 2018-01-12 NOTE — Progress Notes (Signed)
Paged as this patient passed away.  The family was obviiously emotional but did not want prayer.  I asked about next of kin info and gave nurse one daughters name phone and address for contact info.  Family concerned about what to do.  They were still needing time to [facility].  I shared they could have me paged if needed me to return.  They need time to get to deposit box in bank which is closed for weekend.   Evelyn Miller, Chaplain   04/12/2017 1900  Clinical Encounter Type  Visited With Patient and family together;Other (Comment) (patient deceased)  Visit Type Follow-up;Death;ED  Referral From Nurse  Consult/Referral To Chaplain  Spiritual Encounters  Spiritual Needs Other (Comment) (gatered next of kin info for nurse- offered prayer-declined)

## 2018-01-12 NOTE — ED Notes (Signed)
Pulled ativan to give pt, but pt declining too quickly.  Entire vile wasted with RN [place]had Grose.

## 2018-01-12 NOTE — ED Notes (Addendum)
Time of death [id number].

## 2018-01-12 NOTE — ED Notes (Signed)
Family at bedside.  Pt suctioned.

## 2018-01-12 NOTE — ED Triage Notes (Signed)
Pt presents for evaluation of altered mental status. Pt last known well was christmas eve morning. Pt [facility] yesterday but family states she was talking but lethargic at that point. Pt is currently responsive to pain, NPA in place, snoring respirations.

## 2018-01-12 NOTE — ED Notes (Signed)
Family at bedside. 

## 2018-01-12 NOTE — ED Provider Notes (Signed)
[facility] EMERGENCY DEPARTMENT Provider Note   CSN: [id number] Arrival date & time: 2017/01/23  [id number]     History   Chief Complaint Chief Complaint  Patient presents with  . Altered Mental Status    HPI Janene Madeiraorma Wolfgang is a 78 y.o. female.  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  Patient was given some fluids her mental status and vomiting slowly improved but she still has have difficulty walking.  She was discharged home yesterday afternoon and throughout the night she had worsening somnolence this morning she is unresponsive with abnormal breathing.    The history is provided by the EMS personnel and a relative.  Altered Mental Status   This is a new problem. The current episode started 2 days ago. The problem has been gradually worsening. Associated symptoms include confusion, somnolence and unresponsiveness.    Past Medical History:  Diagnosis Date  . AAA (abdominal aortic aneurysm) (HCC)   . CAD (coronary artery disease)   . COPD (chronic obstructive pulmonary disease) (HCC)   . Hypertension   . Ischemic colitis (HCC) 05/09/2013  . Macular degeneration    in left eye  . MI (myocardial infarction) [facility](HCC)     Patient Active Problem List   Diagnosis Date Noted  . Intracranial hemorrhage (HCC) 27-Apr-2017  . End of life care   . Intraparenchymal hemorrhage of brain (HCC)   . Peripheral vascular disease, unspecified (HCC) 07/10/2013  . Ischemic colitis (HCC) 05/09/2013  . GI bleeding 05/09/2013  . Nausea with vomiting 05/09/2013  . Leukocytosis, unspecified 05/09/2013  . Dehydration 05/09/2013  . Hyponatremia 05/09/2013  .  Tobacco abuse 05/09/2013  . Obstructive chronic bronchitis without exacerbation (HCC) 05/09/2013  . HLD (hyperlipidemia) 05/09/2013  . GERD (gastroesophageal reflux disease) 05/09/2013  . HTN (hypertension) 05/09/2013  . AAA (abdominal aortic aneurysm) without rupture (HCC) 05/09/2013    Past Surgical History:  Procedure Laterality Date  . CHOLECYSTECTOMY    . FLEXIBLE SIGMOIDOSCOPY N/A 05/11/2013   Procedure: FLEXIBLE SIGMOIDOSCOPY;  Surgeon: Iva Booparl E Gessner, MD;  Location: [facility] ENDOSCOPY;  Service: Endoscopy;  Laterality: N/A;  . HERNIA REPAIR    . stent in left leg       OB History   No obstetric history on file.      Home Medications    Prior to Admission medications   Medication Sig Start Date End Date Taking? Authorizing Provider  albuterol (PROVENTIL HFA;VENTOLIN HFA) 108 (90 Base) MCG/ACT inhaler Inhale 2 puffs into the lungs as needed. 07/01/16  Yes [provider]  amLODipine (NORVASC) 10 MG tablet Take 10 mg by mouth daily.   Yes [provider]  aspirin 81 MG tablet Take 81 mg by mouth daily.   Yes [provider]  levothyroxine (SYNTHROID, LEVOTHROID) 25 MCG tablet Take 25 mcg by mouth daily. 08/27/16  Yes [provider]  losartan (COZAAR) 100 MG tablet Take 100 mg by mouth daily.   Yes [provider]  dicyclomine (BENTYL) 10 MG capsule Take 1 capsule (10 mg total) by mouth every 6 (six) hours as needed for spasms (cramping). Patient not taking: Reported on 27-Apr-2017 05/12/13   Kathlen ModyAkula, Vijaya, MD  Family History Family History  Problem Relation Age of Onset  . Heart disease Mother   . Hypertension Mother   . Bleeding Disorder Mother   . AAA (abdominal aortic aneurysm) Mother   . Cancer Father   . Cancer Sister   . Cancer Brother   . Heart disease Brother   . AAA (abdominal aortic aneurysm) Brother   . Hypertension Daughter   . Heart disease Son        before age 80  . Hypertension Son     Social  History Social History   Tobacco Use  . Smoking status: Current Every Day Smoker    Packs/day: 1.00    Years: 60.00    Pack years: 60.00    Types: Cigarettes  . Smokeless tobacco: Never Used  Substance Use Topics  . Alcohol use: Yes    Comment: occasional  . Drug use: No     Allergies   Other; Atorvastatin; Ciprofloxacin; Codeine; and Statins   Review of Systems Review of Systems  Unable to perform ROS: Mental status change  Psychiatric/Behavioral: Positive for confusion.     Physical Exam Updated Vital Signs BP (!) 135/94   Pulse 67   Temp 98.2 F (36.8 C) (Rectal)   Resp 15   SpO2 94%   Physical Exam Vitals signs reviewed.  HENT:     Head: Normocephalic and atraumatic.     Nose: Nose normal.  Eyes:     Conjunctiva/sclera: Conjunctivae normal.     Comments: Roving eye movements, constricted pupils that do not react  Neck:     Musculoskeletal: Neck supple.  Cardiovascular:     Pulses: Normal pulses.     Heart sounds: Normal heart sounds.  Pulmonary:     Comments: Tachypneic, sonorous respirations Abdominal:     General: Abdomen is flat. There is no distension.     Tenderness: There is no abdominal tenderness.  Musculoskeletal:        General: No swelling.  Skin:    General: Skin is warm and dry.  Neurological:     Comments: Roving eye movements Moves all extremities to pain No obvious spontaneous movement, intermittent posturing      ED Treatments / Results  Labs (all labs ordered are listed, but only abnormal results are displayed) Labs Reviewed  COMPREHENSIVE METABOLIC PANEL - Abnormal; Notable for the following components:      Result Value   Sodium 132 (*)    Chloride 93 (*)    Glucose, Bld 111 (*)    Creatinine, Ser 1.03 (*)    AST 47 (*)    Total Bilirubin 2.1 (*)    GFR calc non Af Amer 52 (*)    GFR calc Af Amer 60 (*)    Anion gap 17 (*)    All other components within normal limits  CBC WITH DIFFERENTIAL/PLATELET - Abnormal;  Notable for the following components:   WBC 13.1 (*)    Neutro Abs 10.7 (*)    Monocytes Absolute 1.1 (*)    Abs Immature Granulocytes 0.12 (*)    All other components within normal limits  TROPONIN I - Abnormal; Notable for the following components:   Troponin I 0.07 (*)    All other components within normal limits  CULTURE, BLOOD (ROUTINE X 2)  CULTURE, BLOOD (ROUTINE X 2)  MAGNESIUM  I-STAT CG4 LACTIC ACID, ED    EKG EKG Interpretation  Date/Time:  Friday January 07 2018 09:54:42 EST Ventricular Rate:  96 PR  Interval:    QRS Duration: 108 QT Interval:  366 QTC Calculation: 463 R Axis:   77 Text Interpretation:  Sinus rhythm Anterior infarct, old No significant change since last tracing Confirmed by Marily Memos [phone]) on Feb 01, 2018 10:14:08 AM Also confirmed by Marily Memos [phone]), editor Barbette Hair [phone])  on 02-01-18 11:01:50 AM   Radiology Ct Head Wo Contrast  Addendum Date: 01-Feb-2018   ADDENDUM REPORT: 02/01/2018 10:50 ADDENDUM: Critical Value/emergent results were called by telephone at the time of interpretation on 02/01/2018 at 1038 hours to Dr. Marily Memos , who verbally acknowledged these results. Electronically Signed   By: Odessa Fleming M.D.   On: Feb 01, 2018 10:50   Result Date: 02-01-2018 CLINICAL DATA:  78 year old female with unexplained altered mental status. EXAM: CT HEAD WITHOUT CONTRAST TECHNIQUE: Contiguous axial images were obtained from the base of the skull through the vertex without intravenous contrast. COMPARISON:  [facility] Head CTs 01/24/2014 and earlier. FINDINGS: Brain: Large hyperdense hemorrhage in the posterior fossa epicenter in the midline dorsal brainstem and cerebellum. The hemorrhage occupies both sides of midline with an intra-axial component of 49 x 46 by 42 millimeters (AP by transverse by CC), estimated intra-axial blood volume of 47 milliliters. Associated moderate to severe posterior fossa mass effect with  effaced 4th ventricle and basilar cisterns. Mild tonsillar herniation. Confluent brainstem and central cerebellar edema suspected (series 3, image 11). Superimposed moderate to severe lateral and 3rd ventriculomegaly with mild to moderate volume of intraventricular blood mostly layering in the occipital horns. Superimposed small volume of bilateral subarachnoid hemorrhage, most apparent over the left posterosuperior frontal and parietal convexity. No supratentorial midline shift. Confluent bilateral cerebral white matter hypodensity may represent a combination of chronic small vessel disease and transependymal edema. Vascular: Calcified atherosclerosis at the skull base. Skull: Intact.  No acute osseous abnormality identified. Sinuses/Orbits: Visualized paranasal sinuses and mastoids are stable and well pneumatized. A left nasal airway is in place. Other: Mild rightward gaze deviation. No acute orbit or scalp soft tissue findings. IMPRESSION: 1. Large acute posterior fossa hemorrhage in the dorsal brainstem and cerebellum with estimated intra-axial blood volume of 47 mL. 2. Moderate to severe posterior fossa mass effect with confluent edema suspected in the brainstem and including mild tonsillar herniation. 3. Lateral and third ventriculomegaly with transependymal edema and moderate volume intraventricular blood. 4. Small volume superimposed subarachnoid blood, primarily over the convexities. 5. No skull fracture identified. Electronically Signed: By: Odessa Fleming M.D. On: 02-01-2018 10:35   Dg Chest Port 1 View  Result Date: Feb 01, 2018 CLINICAL DATA:  Pt presents for evaluation of altered mental status. Pt last known well was christmas eve morning. Pt was seen at [facility] for AAA eval yesterday but family states she was talking but lethargic at that point. Pt is currently responsive to pain, NPA in place, snoring respirations. EXAM: PORTABLE CHEST 1 VIEW COMPARISON:  CTA, 01/05/2018.  Chest radiographs, 10/18/2017.  FINDINGS: Cardiac silhouette is mildly enlarged. There is significant dilation of the thoracic aorta from the arch through the descending aorta, stable from the prior studies. No mediastinal or hilar masses. Lungs are clear.  No pleural effusion or pneumothorax. Skeletal structures are demineralized but grossly intact. IMPRESSION: No acute cardiopulmonary disease. Electronically Signed   By: Amie Portland M.D.   On: 02-01-18 10:12   Dg Humerus Right  Result Date: Feb 01, 2018 CLINICAL DATA:  Seizure, hematoma of the distal right humerus EXAM: RIGHT HUMERUS - 2+ VIEW COMPARISON:  None. FINDINGS: The right  humerus is intact although diffusely osteopenic. No acute fracture is seen. There is mild degenerative change of the right shoulder with downward sloping acromion possibly resulting in chronic rotator cuff disease. There is soft tissue swelling along the mid distal right lower forearm which may represent bleeding within soft tissues, without discrete hematoma. IMPRESSION: 1. Negative right humerus.  Osteopenia. 2. Probable bleeding in the soft tissues of the mid distal right forearm without discrete hematoma. 3. Degenerative change of the right shoulder. Electronically Signed   By: Dwyane Dee M.D.   On: Jan 10, 2018 11:09    Procedures Procedures (including critical care time)  CRITICAL CARE Performed by: Marily Memos Total critical care time: 35 minutes Critical care time was exclusive of separately billable procedures and treating other patients. Critical care was necessary to treat or prevent imminent or life-threatening deterioration. Critical care was time spent personally by me on the following activities: development of treatment plan with patient and/or surrogate as well as nursing, discussions with consultants, evaluation of patient's response to treatment, examination of patient, obtaining history from patient or surrogate, ordering and performing treatments and interventions, ordering and  review of laboratory studies, ordering and review of radiographic studies, pulse oximetry and re-evaluation of patient's condition.   Medications Ordered in ED Medications  fentaNYL (SUBLIMAZE) injection 25 mcg (has no administration in time range)  LORazepam (ATIVAN) tablet 1 mg (has no administration in time range)    Or  LORazepam (ATIVAN) 2 MG/ML concentrated solution 1 mg (has no administration in time range)    Or  LORazepam (ATIVAN) injection 1 mg (has no administration in time range)  ondansetron (ZOFRAN-ODT) disintegrating tablet 4 mg (has no administration in time range)    Or  ondansetron (ZOFRAN) injection 4 mg (has no administration in time range)  magic mouthwash (15 mLs Oral Given 10-Jan-2018 1617)  LORazepam (ATIVAN) tablet 1 mg ( Oral See Alternative 01/10/2018 1405)    Or  LORazepam (ATIVAN) 2 MG/ML concentrated solution 1 mg ( Sublingual See Alternative Jan 10, 2018 1405)    Or  LORazepam (ATIVAN) injection 1 mg (1 mg Intravenous Given 2018/01/10 1405)  morphine 2 MG/ML injection 1 mg (1 mg Intravenous Given 2018-01-10 1617)  lactated ringers bolus 1,000 mL (0 mLs Intravenous Stopped 2018-01-10 1148)  levETIRAcetam (KEPPRA) IVPB 1000 mg/100 mL premix (0 mg Intravenous Stopped 10-Jan-2018 1105)     Initial Impression / Assessment and Plan / ED Course  I have reviewed the triage vital signs and the nursing notes.  Pertinent labs & imaging results that were available during my care of the patient were reviewed by me and considered in my medical decision making (see chart for details).   Patient found to have a significantly large brainstem bleed likely secondary to an acute ischemic event a couple days ago.  Neurology consulted and felt that this was unlikely to be survivable but recommended neurosurgery discussion just to ensure that that was the case.  Discussed with Dr.Nudleman who stated there was no life-changing intervention that could be done by neurosurgery.  I discussed with  the family they continued to agree with being a DNR patient and comfort care only.  Comfort care measures initiated and discussed with family medicine who will admit for comfort care.  Final Clinical Impressions(s) / ED Diagnoses   Final diagnoses:  Intraparenchymal hemorrhage of brain (HCC)  End of life care    ED Discharge Orders    None       Padraig Nhan, Barbara Cower, MD 2018-01-10 [phone]

## 2018-01-12 NOTE — H&P (Addendum)
[facility] History and Physical Service Pager: [phone]  Patient name: Evelyn Madeiraorma Harrigan Medical record number: [id number] Date of birth: 31-Dec-1940 Age: 78 y.o. Gender: female  Primary Care Provider: Angelica ChessmanAguiar, Rafaela M, MD Consultants: palliative Code Status: DNR  Chief Complaint: AMS, loss of consciousness.    Assessment and Plan: Evelyn Miller is a 78 y.o. female presenting with altered mental status and loss of consciousness and found to have an intracranial hemorrhage affecting her brainstem and cerebellum . PMH is significant for HLD, GERD, HTN, AAA w/o ruptures.    Intracranial Hemorrhage - patient has become progressively altered and unresponsive since christmas eve.  On exam patient is non-communicative, was able to move lower extremities in response to touch but not command.  Pupils fixed with constant motion in side to side gaze, no responding to light.  CT head shows large acute posterior fossa hemorrhage in dorsal brainstem and cerebellum, mild tonsillar herniation, lateral/third ventriculomegaly.  neurosurgery saw patient and determined ventriculostomy would not be beneficial.  Patient has been DNR for several years. Family is aware of her wishes and is in agreement with them.  Will make patient comfort care.  - comfort care measures - palliative consult - chaplain consult - fentanyl 25mcg q9049m PRN - 1mg  ativan q4h.  - zofran for nausea PRN - magic mouthwash for mouth pain.  - NPO  HTN - on amlodipine 10mg  and cozaar 100mg  qdaily at home.  at home. holding medications while on comfort care.   HLD - not taking statin at home. Holding medications while on comfort care.   GERD - not taking medications at home. holding inpatient medications while on comfort care.   Hypothyroidism - on synthroid 25mcg at home.  Holding medications while on comfort care.    FEN/GI: NPO Prophylaxis: none  Disposition: likely hospital death.   History of  Present Illness:  Evelyn Madeiraorma Bitner is a 78 y.o. female presenting with 3 days of increasing somnolence and confusion.  Per her family, starting christmas eve she started becoming less active/less communicative and more confused. she was having vomiting and was taken to [facility] thought her problems were due to mesenteric ischemia.  She was given fluids and discharged yesterday. As of this morning she was not responsive and family brought her to the emergency room.    Review Of Systems: unable to obtain due to patient's condition  Review of Systems  Constitutional: Negative for chills, diaphoresis and fever.  Respiratory: Negative for cough and shortness of breath.   Cardiovascular: Negative for chest pain and palpitations.  Gastrointestinal: Negative for abdominal pain, heartburn, nausea and vomiting.  Genitourinary: Negative for dysuria, frequency and urgency.  Musculoskeletal: Negative for myalgias.  Neurological: Positive for loss of consciousness and headaches.    Patient Active Problem List   Diagnosis Date Noted  . Peripheral vascular disease, unspecified (HCC) 07/10/2013  . Ischemic colitis (HCC) 05/09/2013  . GI bleeding 05/09/2013  . Nausea with vomiting 05/09/2013  . Leukocytosis, unspecified 05/09/2013  . Dehydration 05/09/2013  . Hyponatremia 05/09/2013  . Tobacco abuse 05/09/2013  . Obstructive chronic bronchitis without exacerbation (HCC) 05/09/2013  . HLD (hyperlipidemia) 05/09/2013  . GERD (gastroesophageal reflux disease) 05/09/2013  . HTN (hypertension) 05/09/2013  . AAA (abdominal aortic aneurysm) without rupture (HCC) 05/09/2013    Past Medical History: Past Medical History:  Diagnosis Date  . AAA (abdominal aortic aneurysm) (HCC)   . CAD (coronary artery disease)   .  COPD (chronic obstructive pulmonary disease) (HCC)   . Hypertension   . Ischemic colitis (HCC) 05/09/2013  . Macular degeneration    in left eye   . MI (myocardial infarction) [facility](HCC)     Past Surgical History: Past Surgical History:  Procedure Laterality Date  . CHOLECYSTECTOMY    . FLEXIBLE SIGMOIDOSCOPY N/A 05/11/2013   Procedure: FLEXIBLE SIGMOIDOSCOPY;  Surgeon: Iva Booparl E Gessner, MD;  Location: [facility] ENDOSCOPY;  Service: Endoscopy;  Laterality: N/A;  . HERNIA REPAIR    . stent in left leg      Social History: Social History   Tobacco Use  . Smoking status: Current Every Day Smoker    Packs/day: 1.00    Years: 60.00    Pack years: 60.00    Types: Cigarettes  . Smokeless tobacco: Never Used  Substance Use Topics  . Alcohol use: Yes    Comment: occasional  . Drug use: No   Additional social history:   Please also refer to relevant sections of EMR.  Family History: Family History  Problem Relation Age of Onset  . Heart disease Mother   . Hypertension Mother   . Bleeding Disorder Mother   . AAA (abdominal aortic aneurysm) Mother   . Cancer Father   . Cancer Sister   . Cancer Brother   . Heart disease Brother   . AAA (abdominal aortic aneurysm) Brother   . Hypertension Daughter   . Heart disease Son        before age 80  . Hypertension Son     Allergies and Medications: Allergies  Allergen Reactions  . Other     [place] nuts make pt's throat itch.  . Atorvastatin Other (See Comments)    Leg Muscle Pain Leg Muscle Pain   . Ciprofloxacin Itching  . Codeine     weakness  . Statins Other (See Comments)    simvastatin   No current facility-administered medications on file prior to encounter.    Current Outpatient Medications on File Prior to Encounter  Medication Sig Dispense Refill  . albuterol (PROVENTIL HFA;VENTOLIN HFA) 108 (90 Base) MCG/ACT inhaler Inhale 2 puffs into the lungs as needed.    Marland Kitchen. amLODipine (NORVASC) 10 MG tablet Take 10 mg by mouth daily.    Marland Kitchen. aspirin 81 MG tablet Take 81 mg by mouth daily.    Marland Kitchen. levothyroxine (SYNTHROID, LEVOTHROID) 25 MCG tablet Take 25 mcg by mouth daily.    Marland Kitchen.  losartan (COZAAR) 100 MG tablet Take 100 mg by mouth daily.    Marland Kitchen. losartan-hydrochlorothiazide (HYZAAR) 100-25 MG per tablet Take 1 tablet by mouth daily.    Marland Kitchen. dicyclomine (BENTYL) 10 MG capsule Take 1 capsule (10 mg total) by mouth every 6 (six) hours as needed for spasms (cramping). 10 capsule 0  . isosorbide dinitrate (ISORDIL) 30 MG tablet Take 60 mg by mouth daily.     Marland Kitchen. omeprazole (PRILOSEC) 40 MG capsule Take 60 mg by mouth daily.     . ondansetron (ZOFRAN) 4 MG tablet Take 4 mg by mouth every 12 (twelve) hours as needed for nausea or vomiting.    Marland Kitchen. PRESCRIPTION MEDICATION Place 1 application in ear(s) 3 (three) times a week. Ear itching      Objective: BP (!) 138/124   Pulse (!) 31   Temp 98.2 F (36.8 C) (Rectal)   Resp 20   SpO2 90%  Exam: General: laying in bed, eyes closed, unresponsive to speech.  Eyes: pupils fixed, unresponsive to light.  Rhythmic,  slowed horizontal nystagmus but patient is not deliberately tracking movement.   ENTM: mouth open. No oropharyngeal erythema.  Cardiovascular: regular rhythm. Normal rate.  No murmurs.  Respiratory: lungs clear to auscultation anteriorly.   Gastrointestinal: normal bowel sounds.  Soft.  Nondistended.  MSK: patient does not move limbs to command.   Derm: several areas of bruising along limbs.  Neuro: CN not intact.  Patient unable to follow commands.  Lower extremities react to touch.  Patellar reflex intact bilaterally.   Psych: unconscious.    Labs and Imaging: CBC BMET  Recent Labs  Lab 01/09/18 1012  WBC 13.1*  HGB 12.7  HCT 38.4  PLT 173   Recent Labs  Lab 01/09/18 1012  NA 132*  K 4.1  CL 93*  CO2 22  BUN 16  CREATININE 1.03*  GLUCOSE 111*  CALCIUM 9.4       Sandre Kitty, MD 09-Jan-2018, 12:50 PM PGY-1, [facility] FPTS Intern pager: [phone], text pages welcome  Resident Attestation  I saw and evaluated the patient, performing the key elements of the service.I personally  performed or re-performed the history, physical exam, and medical decision making activities of this service and have verified that the service and findings are accurately documented in the note.I developed the management plan that is described in the note, and I agree with the content, with my edits above.  Jules Schick, DO PGY-2, [facility]

## 2018-01-12 DEATH — deceased

## 2020-03-28 IMAGING — DX DG HUMERUS 2V *R*
1 series · 2 of 2 positions shown · non-contrast
Comparison: None.

CLINICAL DATA: Seizure, hematoma of the distal right humerus

EXAM:
RIGHT HUMERUS - 2+ VIEW

[Series 1: humerus · 0.14mm/px · 2 of 2 slices shown]
[im 1/2]
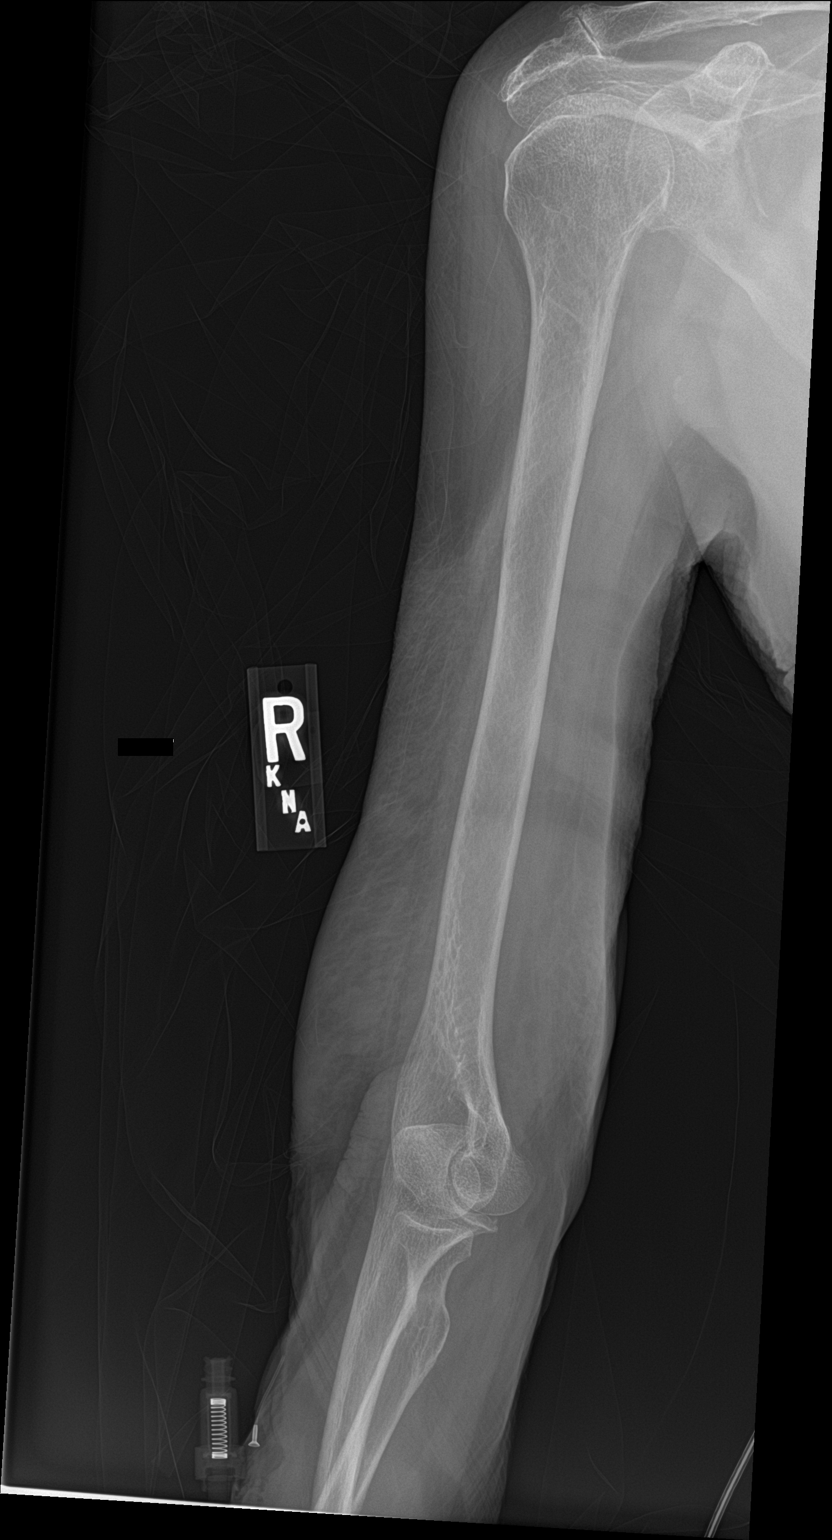
[im 2/2]
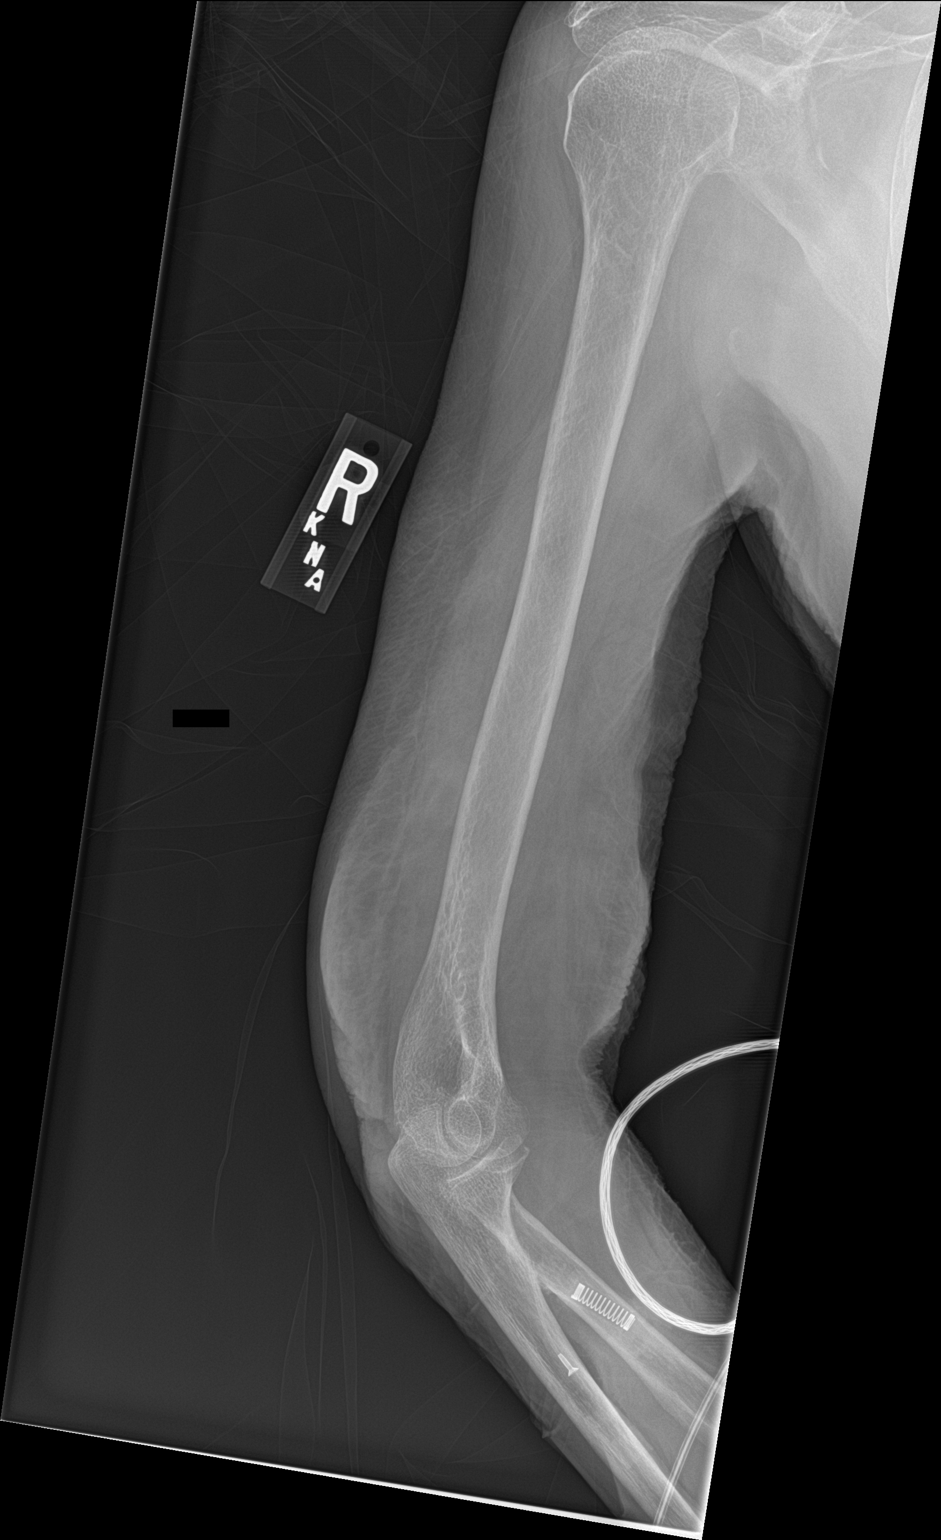

[2 of 2 positions shown; findings below may reference images not displayed]

FINDINGS: The right humerus is intact although diffusely osteopenic. No acute
fracture is seen. There is mild degenerative change of the right
shoulder with downward sloping acromion possibly resulting in
chronic rotator cuff disease. There is soft tissue swelling along
the mid distal right lower forearm which may represent bleeding
within soft tissues, without discrete hematoma.
IMPRESSION: 1. Negative right humerus.  Osteopenia.
2. Probable bleeding in the soft tissues of the mid distal right
forearm without discrete hematoma.
3. Degenerative change of the right shoulder.

## 2020-03-28 IMAGING — CT CT HEAD W/O CM
4 series · 16 of 47 positions shown, 18 images · non-contrast
Comparison: [REDACTED] Head CTs 01/24/2014 and
earlier.

Addendum:
CLINICAL DATA: 77-year-old female with unexplained altered mental
status.

EXAM:
CT HEAD WITHOUT CONTRAST
TECHNIQUE: Contiguous axial images were obtained from the base of the skull
through the vertex without intravenous contrast.

[Series 3: head without · axial · non-contrast · 0.41mm/px · z∈[-69,+56]mm · 7 of 35 slices shown, 9 images]
[im 5/35  brain]
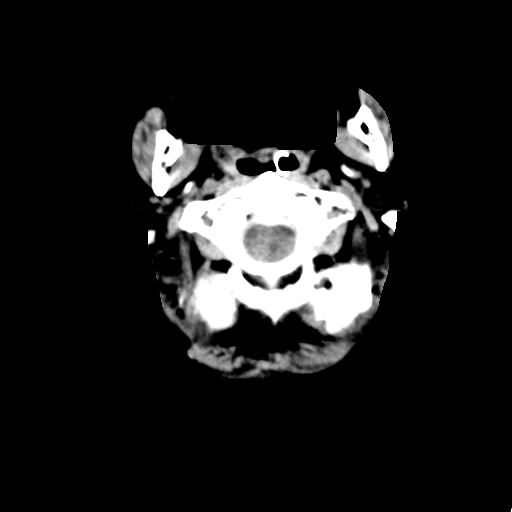
[im 5/35  bone]
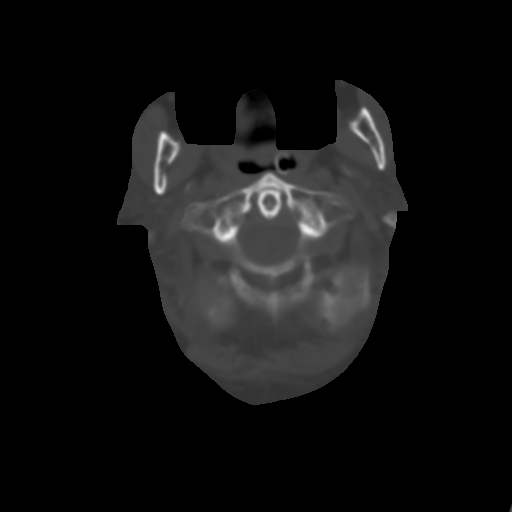
[im 9/35  brain]
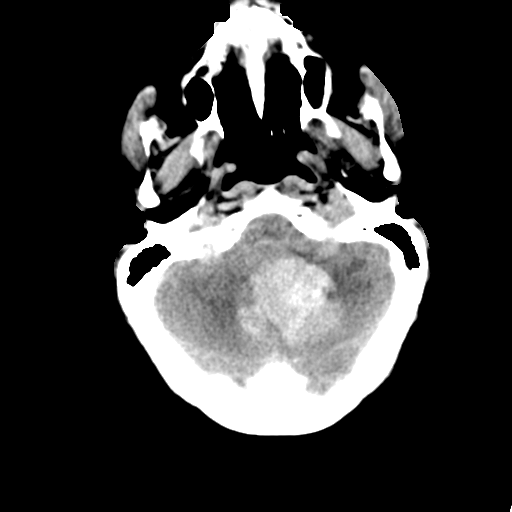
[im 13/35  brain]
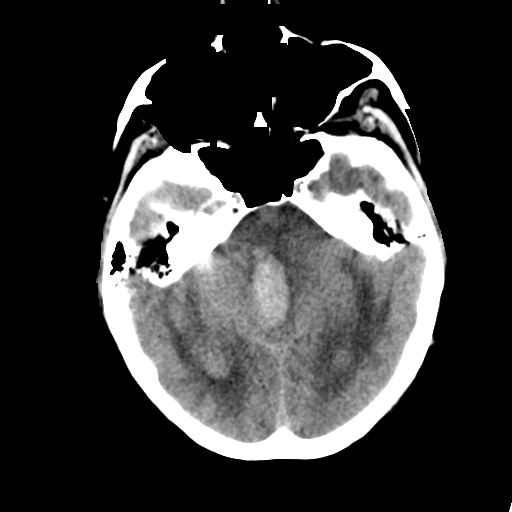
[im 18/35  brain]
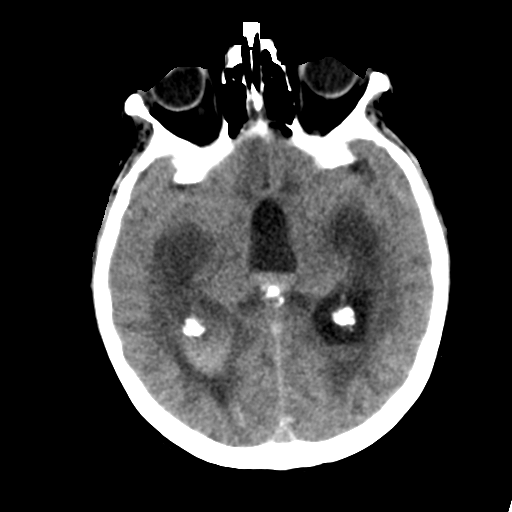
[im 22/35  brain]
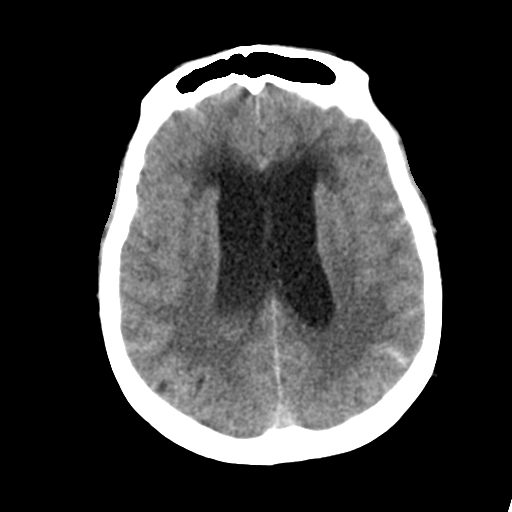
[im 22/35  bone]
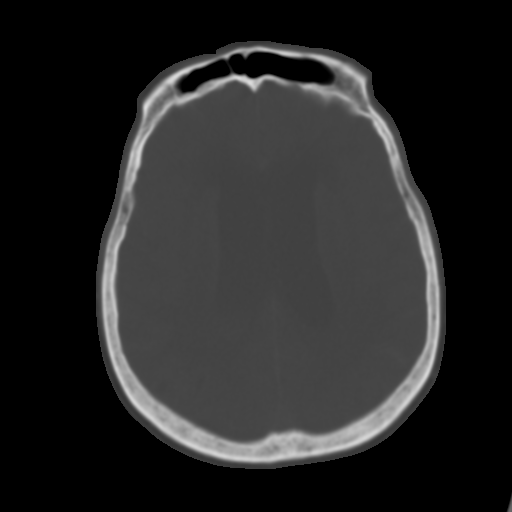
[im 26/35  brain]
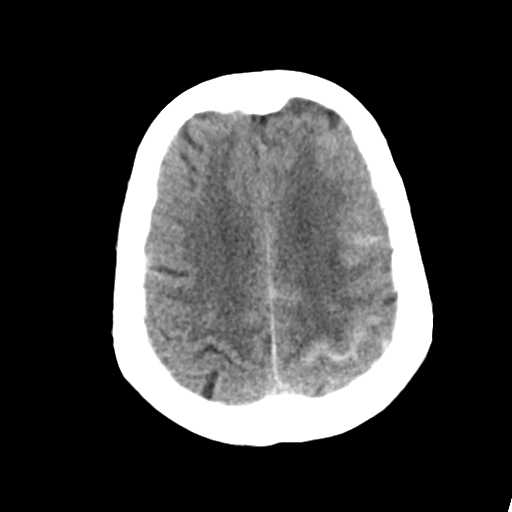
[im 30/35  brain]
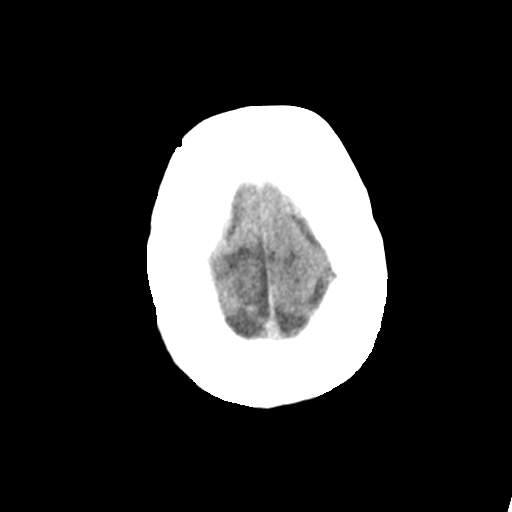

[Series 4: head bone · axial · 0.41mm/px · z∈[-73,-37]mm · 3 of 88 slices shown]
[im 9/88  bone]
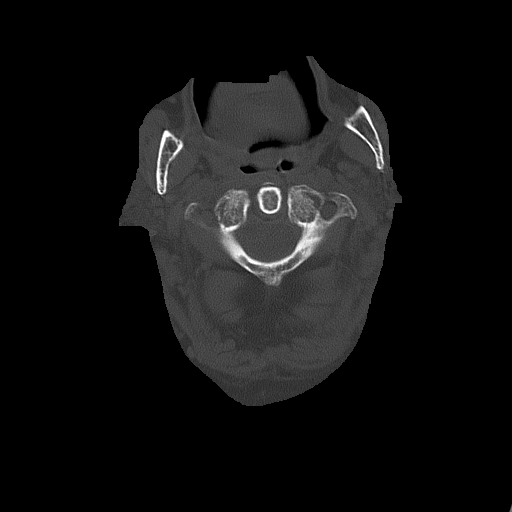
[im 18/88  bone]
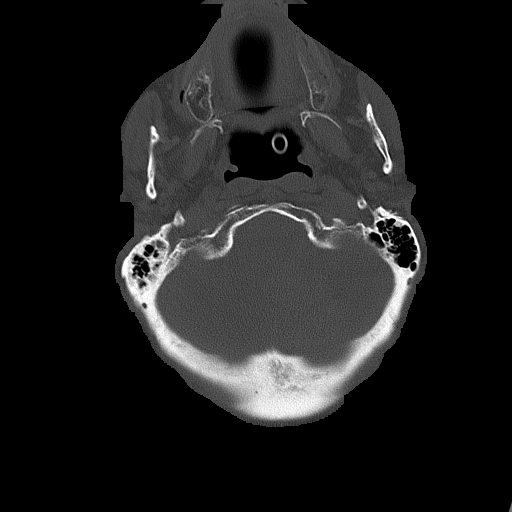
[im 27/88  bone]
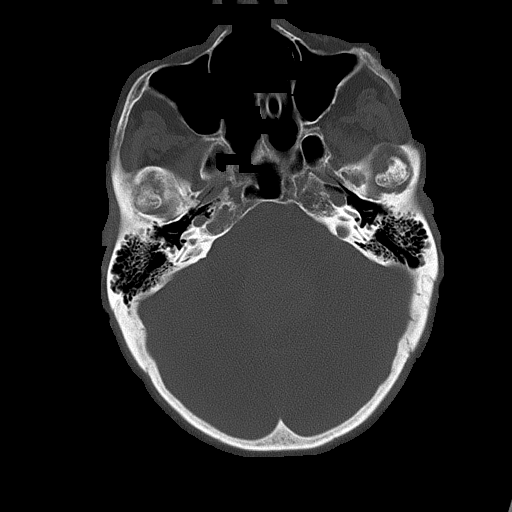

[Series 5: head without cor · coronal · non-contrast · 0.32mm/px · 3 of 68 slices shown]
[im 23/68  brain]
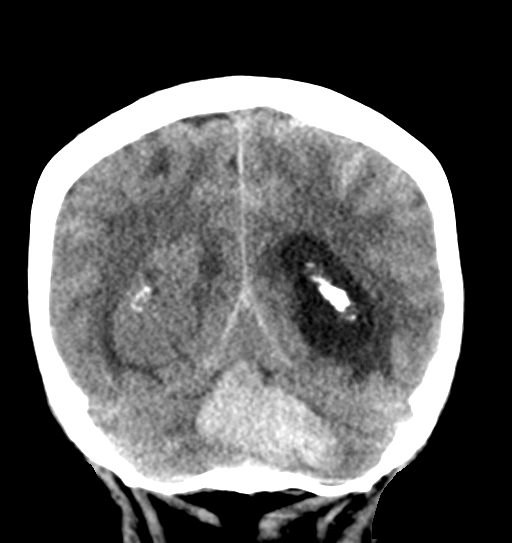
[im 30/68  brain]
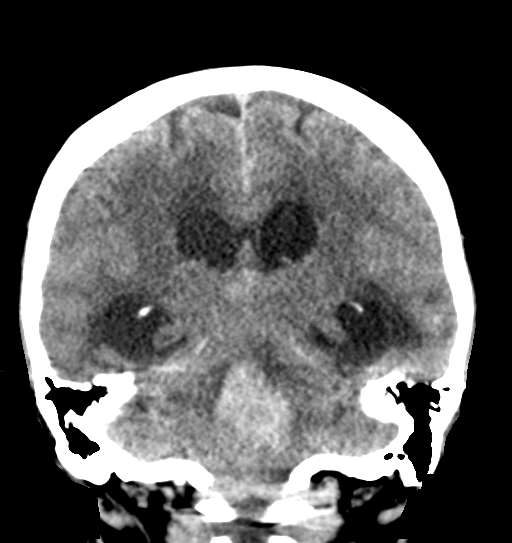
[im 38/68  brain]
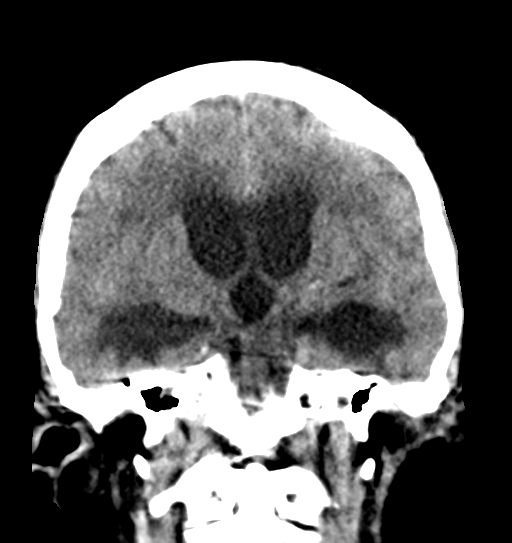

[Series 6: head without sag · sagittal · non-contrast · 0.33mm/px · 3 of 49 slices shown]
[im 17/49  brain]
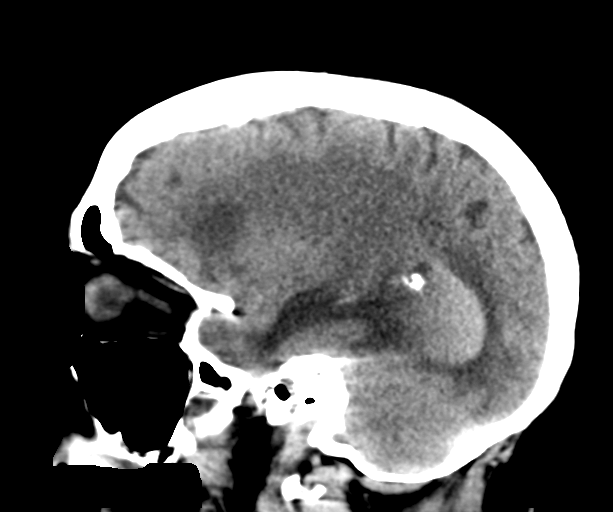
[im 25/49  brain]
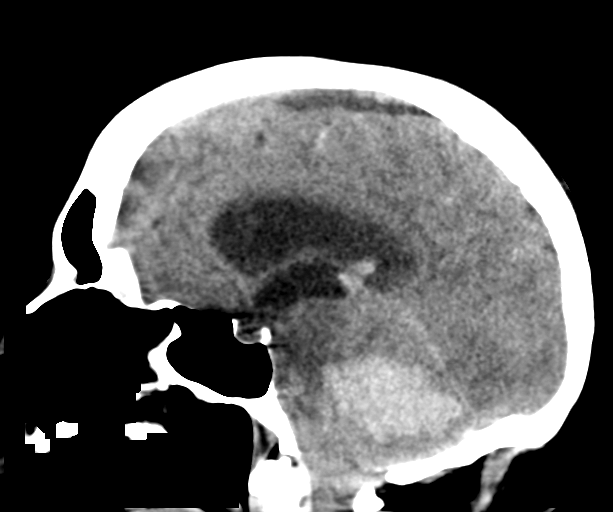
[im 33/49  brain]
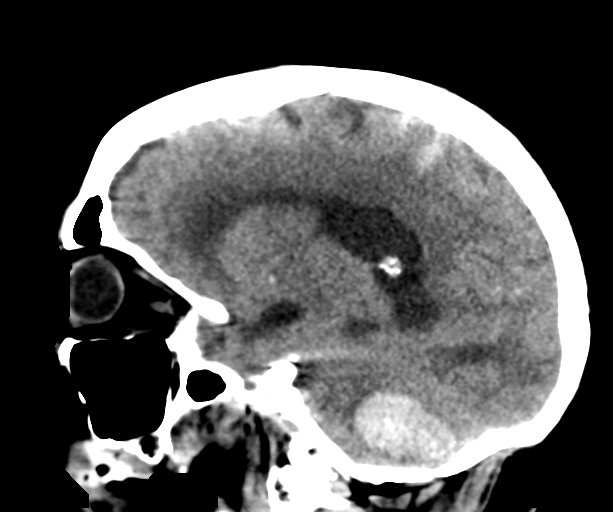

[16 of 47 positions shown; findings below may reference images not displayed]

FINDINGS: Brain: Large hyperdense hemorrhage in the posterior fossa epicenter
in the midline dorsal brainstem and cerebellum. The hemorrhage
occupies both sides of midline with an intra-axial component of 49 x
46 by 42 millimeters (AP by transverse by CC), estimated intra-axial
blood volume of 47 milliliters. Associated moderate to severe
posterior fossa mass effect with effaced 4th ventricle and basilar
cisterns. Mild tonsillar herniation. Confluent brainstem and central
cerebellar edema suspected (series 3, image 11).

Superimposed moderate to severe lateral and 3rd ventriculomegaly
with mild to moderate volume of intraventricular blood mostly
layering in the occipital horns. Superimposed small volume of
bilateral subarachnoid hemorrhage, most apparent over the left
posterosuperior frontal and parietal convexity. No supratentorial
midline shift. Confluent bilateral cerebral white matter hypodensity
may represent a combination of chronic small vessel disease and
transependymal edema.

Vascular: Calcified atherosclerosis at the skull base.

Skull: Intact.  No acute osseous abnormality identified.

Sinuses/Orbits: Visualized paranasal sinuses and mastoids are stable
and well pneumatized. A left nasal airway is in place.

Other: Mild rightward gaze deviation. No acute orbit or scalp soft
tissue findings.
IMPRESSION: 1. Large acute posterior fossa hemorrhage in the dorsal brainstem
and cerebellum with estimated intra-axial blood volume of 47 mL.
2. Moderate to severe posterior fossa mass effect with confluent
edema suspected in the brainstem and including mild tonsillar
herniation.
3. Lateral and third ventriculomegaly with transependymal edema and
moderate volume intraventricular blood.
4. Small volume superimposed subarachnoid blood, primarily over the
convexities.
5. No skull fracture identified.

ADDENDUM:
Critical Value/emergent results were called by telephone at the time
of interpretation on 01/07/2018 at 5210 hours to Dr. JAHAIRA FITZSIMONS ,
who verbally acknowledged these results.

*** End of Addendum ***
